# Patient Record
Sex: Female | Born: 1970 | Race: White | Hispanic: No | Marital: Married | Smoking: Never smoker
Health system: Southern US, Community
[De-identification: ages and names within clinical notes are randomized; demographics above are authoritative.]

## PROBLEM LIST (undated history)

## (undated) DIAGNOSIS — F419 Anxiety disorder, unspecified: Secondary | ICD-10-CM

## (undated) DIAGNOSIS — J45909 Unspecified asthma, uncomplicated: Secondary | ICD-10-CM

## (undated) HISTORY — PX: DIAGNOSTIC LAPAROSCOPY: SUR761

## (undated) HISTORY — DX: Anxiety disorder, unspecified: F41.9

## (undated) HISTORY — PX: APPENDECTOMY: SHX54

## (undated) HISTORY — DX: Unspecified asthma, uncomplicated: J45.909

---

## 1999-02-12 ENCOUNTER — Other Ambulatory Visit: Admission: RE | Admit: 1999-02-12 | Discharge: 1999-02-12 | Payer: Self-pay | Admitting: Gynecology

## 2000-03-04 ENCOUNTER — Other Ambulatory Visit: Admission: RE | Admit: 2000-03-04 | Discharge: 2000-03-04 | Payer: Self-pay | Admitting: Obstetrics and Gynecology

## 2000-07-18 ENCOUNTER — Ambulatory Visit (HOSPITAL_COMMUNITY): Admission: RE | Admit: 2000-07-18 | Discharge: 2000-07-18 | Payer: Self-pay | Admitting: Obstetrics and Gynecology

## 2001-03-20 ENCOUNTER — Other Ambulatory Visit: Admission: RE | Admit: 2001-03-20 | Discharge: 2001-03-20 | Payer: Self-pay | Admitting: Obstetrics and Gynecology

## 2002-03-26 ENCOUNTER — Other Ambulatory Visit: Admission: RE | Admit: 2002-03-26 | Discharge: 2002-03-26 | Payer: Self-pay | Admitting: Obstetrics and Gynecology

## 2003-05-02 ENCOUNTER — Other Ambulatory Visit: Admission: RE | Admit: 2003-05-02 | Discharge: 2003-05-02 | Payer: Self-pay | Admitting: Obstetrics and Gynecology

## 2004-05-15 ENCOUNTER — Other Ambulatory Visit: Admission: RE | Admit: 2004-05-15 | Discharge: 2004-05-15 | Payer: Self-pay | Admitting: Obstetrics and Gynecology

## 2005-11-01 ENCOUNTER — Other Ambulatory Visit: Admission: RE | Admit: 2005-11-01 | Discharge: 2005-11-01 | Payer: Self-pay | Admitting: Obstetrics and Gynecology

## 2008-09-30 ENCOUNTER — Ambulatory Visit (HOSPITAL_COMMUNITY): Admission: RE | Admit: 2008-09-30 | Discharge: 2008-09-30 | Payer: Self-pay | Admitting: Geriatric Medicine

## 2009-11-30 IMAGING — CR DG CHEST 2V
2 series · 2 of 2 positions shown · non-contrast
Comparison: None

CLINICAL DATA: Pneumonia.  Coughing.  Acute right-sided chest pain.

CHEST - 2 VIEW

[w chest pa]
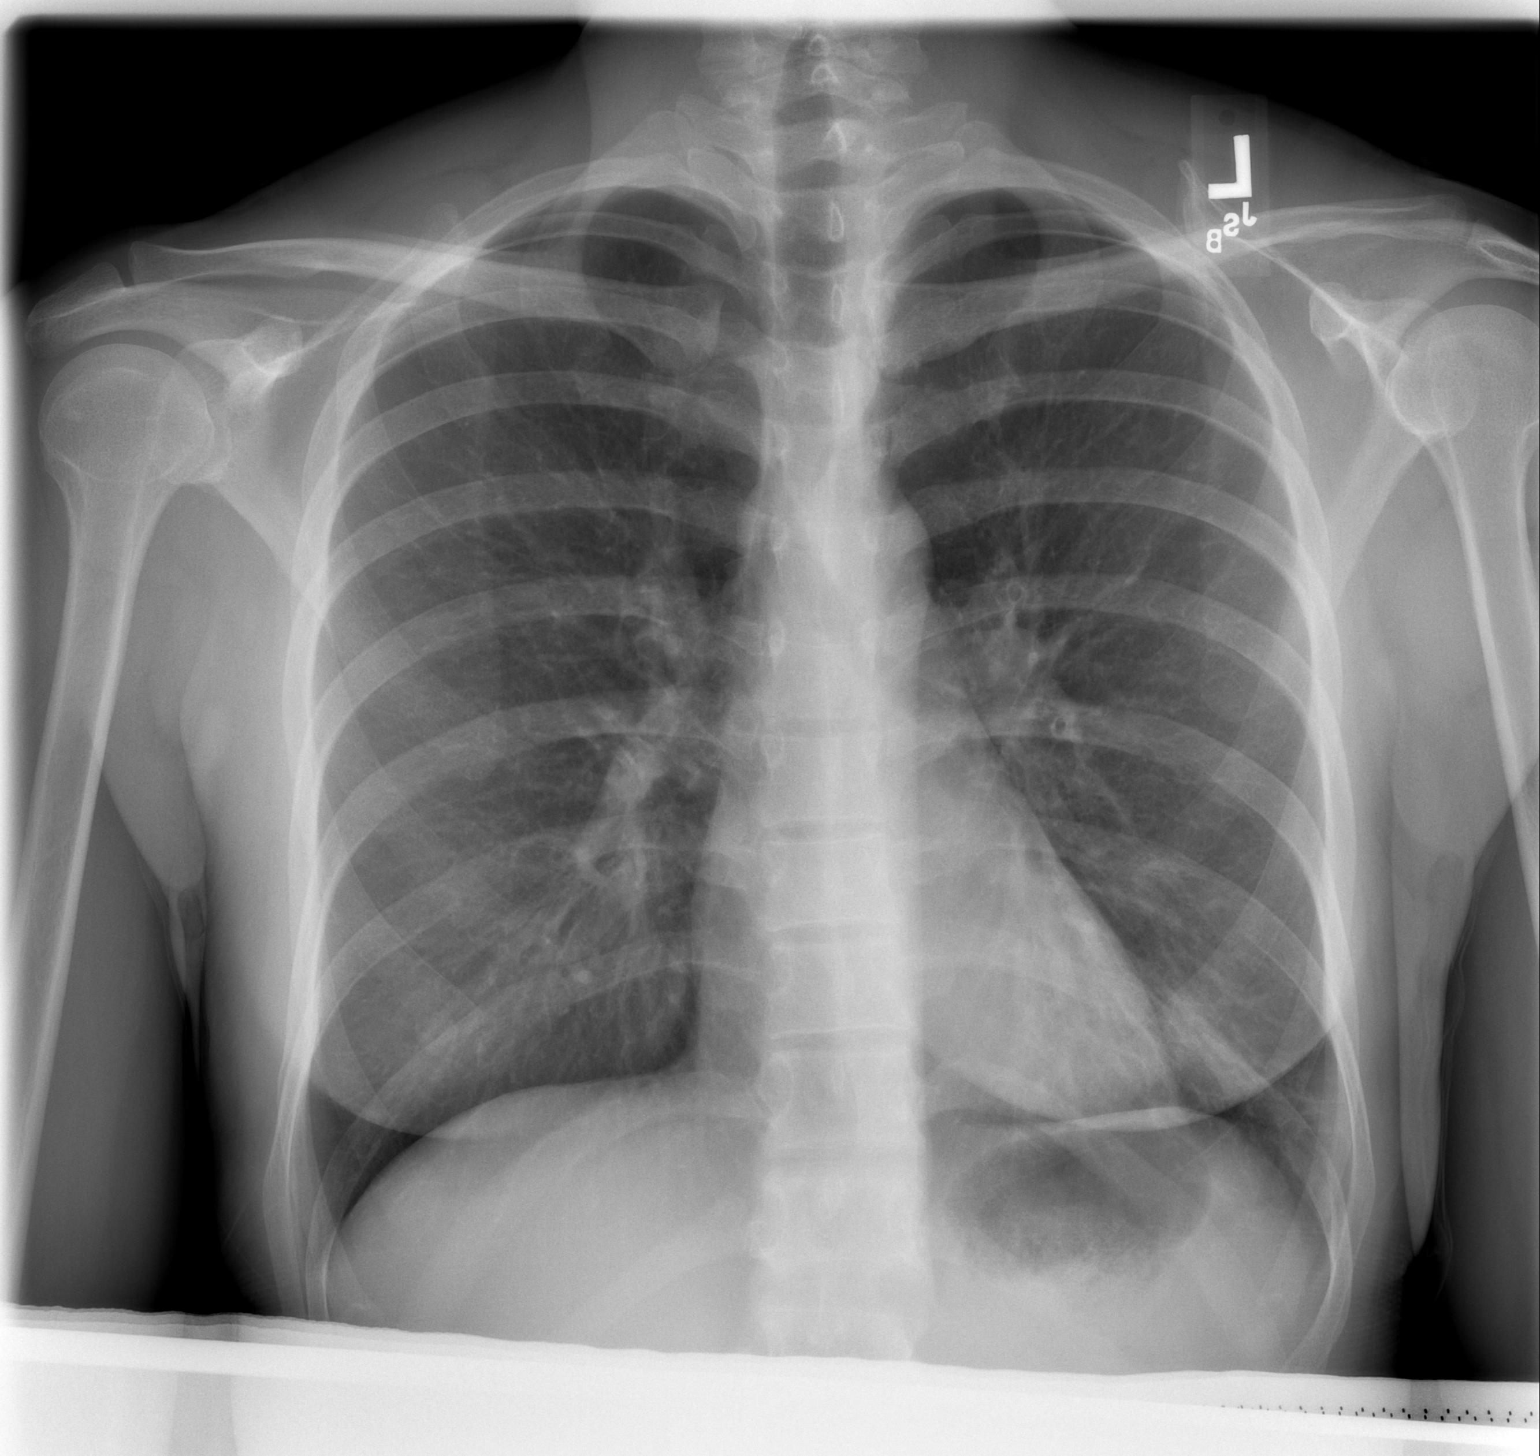

[w chest lat]
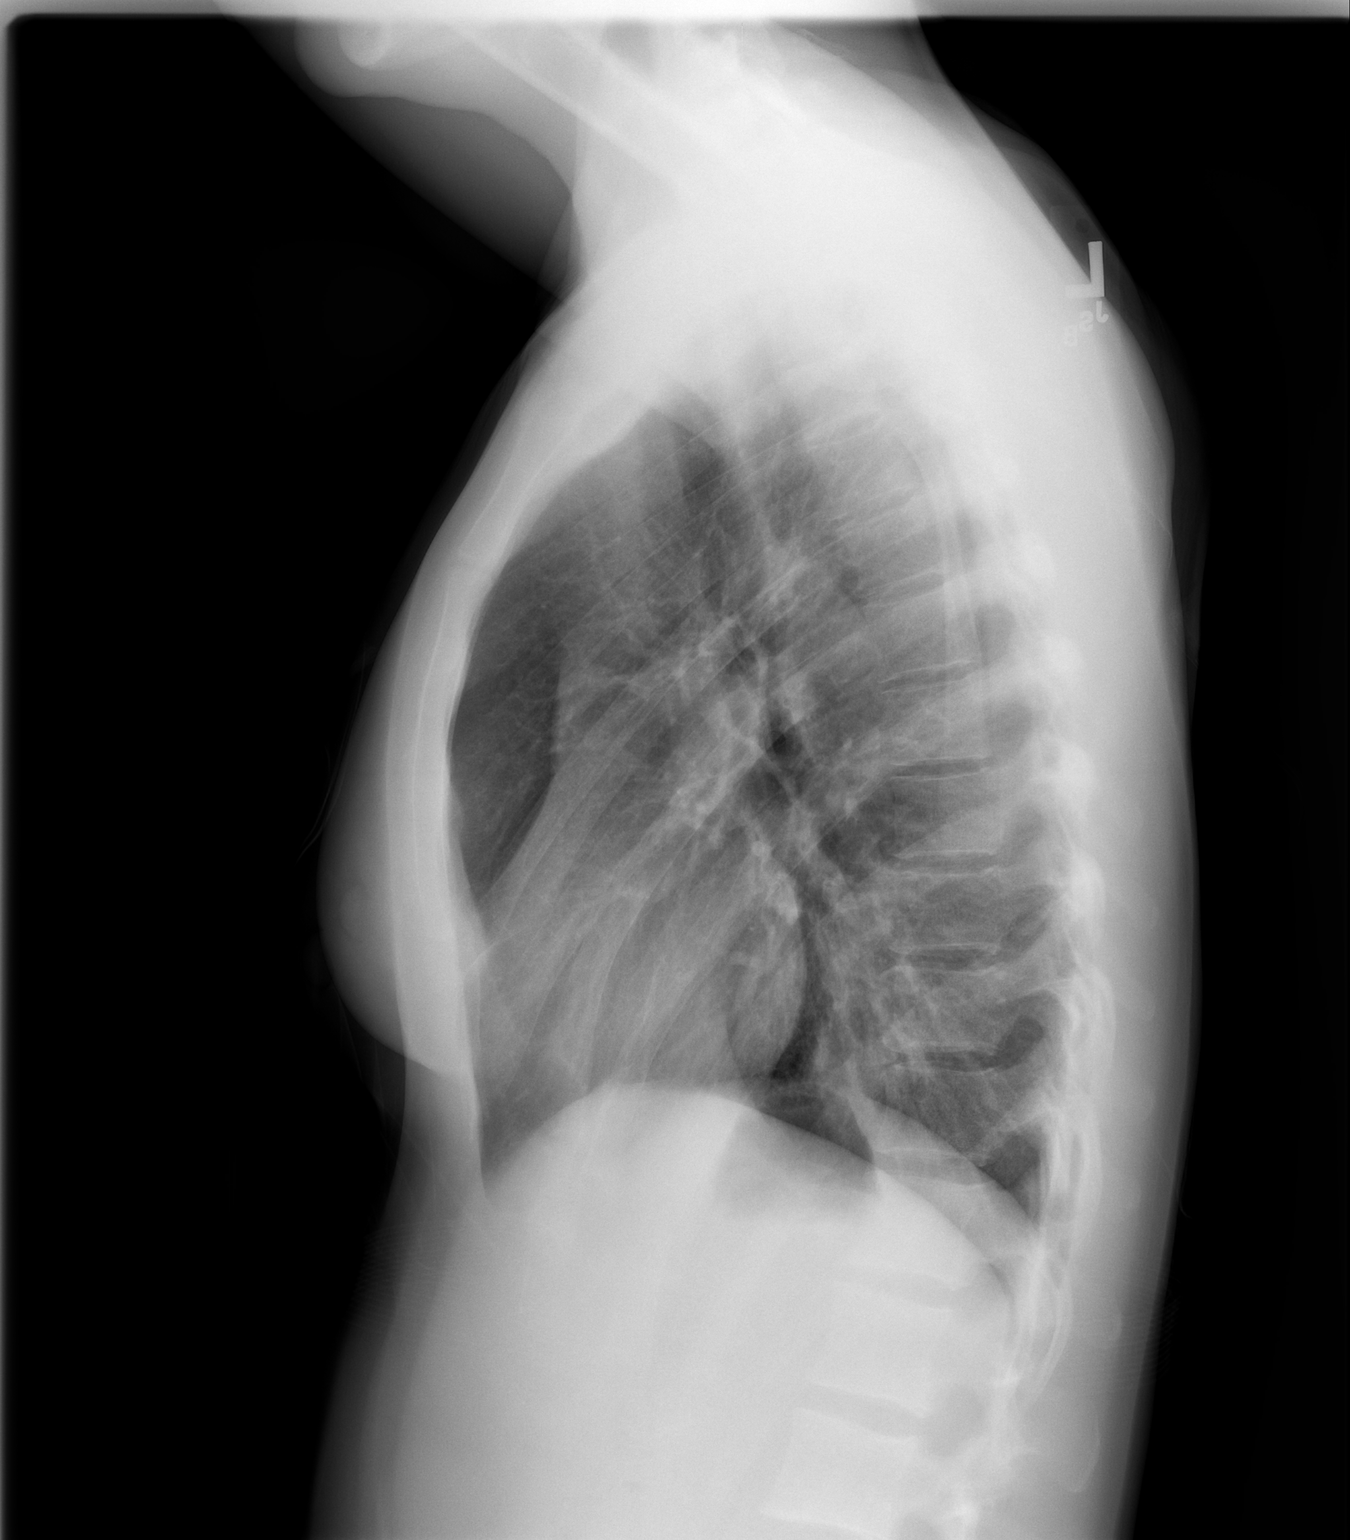

[2 of 2 positions shown; findings below may reference images not displayed]

FINDINGS: Mild pulmonary infiltrate is seen in the left lower lobe,
consistent with pneumonia.

The right lung is clear.  There is no evidence of pneumothorax or
pneumomediastinum.

There is no evidence of pleural effusion.  Heart size and
mediastinal contours are normal.
IMPRESSION: 1.  Left lower lobe infiltrate, consistent with pneumonia.
2.  No evidence of pneumothorax or pleural effusion.

This report was discussed with Dr. Silvina Raut by telephone on

## 2011-02-22 NOTE — Op Note (Signed)
[facility]  Patient:    [name], Karen                        MRN: [id number] Proc. Date: 07/18/00 Adm. Date:  [id number] Attending:  Marcelle Overlie                           Operative Report  PREOPERATIVE DIAGNOSIS:       Desires permanent sterilization.  POSTOPERATIVE DIAGNOSIS:      Desires permanent sterilization.  PROCEDURE:                    Laparoscopic bilateral tubal ligation.  SURGEON:                      Marcelle Overlie, M.D.  ANESTHESIA:                   General.  ESTIMATED BLOOD LOSS:         Minimal.  FINDINGS:                     Normal uterus, tubes, and ovaries.  COMPLICATIONS:                None.  DESCRIPTION OF PROCEDURE:     The patient was taken to the operating room where she was intubated without difficulty.  She was then placed in the lateral lithotomy position.  The patient was then prepped and draped in the usual sterile fashion.  An in-and-out catheter was used to empty the bladder. A uterine manipulator was inserted into the uterus.  Attention was turned to the abdomen where a small infraumbilical incision was made with the scalpel, and the Veress needle was inserted through this incision without difficulty. Intraperitoneal placement was confirmed by the saline aspiration and drop test.  CO2 was attached, and a pneumoperitoneum was achieved with an opening pressure of 3 mmHg.  After the the pneumoperitoneum was achieved, the Veress needle was removed, and a 10-mm trocar was inserted in the same incision without difficulty.  The laparoscope was inserted through the trocar sheath, and the patient was placed in Trendelenburg position.  We then performed a careful abdominal and pelvic survey, and the upper abdomen appeared normal. There was one small, thin omental adhesion at the area of her previous appendectomy incision.  The pelvis was normal with a normal uterus which was anteverted.  There was no evidence of  endometriosis.  The adnexa were normal as well.  Using the operative laparoscope, we then performed bilateral tubal ligation using electrocautery using the triple-burn technique.  Bilateral fimbria were visualized before the electrocoagulation was performed.  The patient tolerated the procedure well.  The pneumoperitoneum was released.  All instruments were removed from the abdomen and the vagina.  The incision was closed using 4-0 Vicryl suture interrupted, and 0.25% Marcaine was infiltrated at the area of the incision.  The patient tolerated the procedure extremely well.  She went to the recovery room in stable condition.  COMPLICATIONS:  None. DD:  07/18/00 TD:  07/20/00 Job: [id number]

## 2014-11-24 ENCOUNTER — Encounter (HOSPITAL_COMMUNITY): Payer: Self-pay | Admitting: *Deleted

## 2014-11-30 NOTE — H&P (Signed)
44 year old G 0 with history of BTL presents for D and C, Hysteroscopy, and HTA. Reports menorrhagia - ultrasound in office - 6 mm polyp  No past medical history on file. Past Surgical History  Procedure Laterality Date  . Diagnostic laparoscopy    . Appendectomy     Augmentin and Clindamycin/lincomycin . No current facility-administered medications on file prior to encounter.   No current outpatient prescriptions on file prior to encounter.   There were no vitals taken for this visit. No results found for this or any previous visit (from the past 24 hour(s)). General alert and oriented Lung CTAB Car RRR Abdomen is soft and non tender Pelvic unremarkable  IMPRESSION: Menorrhagia Endometrial Polyp  PLAN: D and C, Hysteroscopy, HTA Risks reviewed  Consent signed

## 2014-12-04 MED ORDER — CIPROFLOXACIN IN D5W 400 MG/200ML IV SOLN
400.0000 mg | INTRAVENOUS | Status: AC
  Administered 2014-12-05: 400 mg via INTRAVENOUS
  Filled 2014-12-04: qty 200

## 2014-12-04 MED ORDER — METRONIDAZOLE IN NACL 5-0.79 MG/ML-% IV SOLN
500.0000 mg | INTRAVENOUS | Status: AC
  Administered 2014-12-05: 500 mg via INTRAVENOUS
  Filled 2014-12-04: qty 100

## 2014-12-05 ENCOUNTER — Ambulatory Visit (HOSPITAL_COMMUNITY): Payer: BLUE CROSS/BLUE SHIELD | Admitting: Anesthesiology

## 2014-12-05 ENCOUNTER — Ambulatory Visit (HOSPITAL_COMMUNITY)
Admission: RE | Admit: 2014-12-05 | Discharge: 2014-12-05 | Disposition: A | Discharge status: Home / Self Care | Payer: BLUE CROSS/BLUE SHIELD | Source: Ambulatory Visit | Attending: Obstetrics and Gynecology | Admitting: Obstetrics and Gynecology

## 2014-12-05 ENCOUNTER — Encounter (HOSPITAL_COMMUNITY): Payer: Self-pay | Admitting: *Deleted

## 2014-12-05 ENCOUNTER — Encounter (HOSPITAL_COMMUNITY)
Admission: RE | Disposition: A | Discharge status: Home / Self Care | Payer: Self-pay | Source: Ambulatory Visit | Attending: Obstetrics and Gynecology

## 2014-12-05 DIAGNOSIS — N840 Polyp of corpus uteri: Secondary | ICD-10-CM | POA: Diagnosis not present

## 2014-12-05 DIAGNOSIS — Z9049 Acquired absence of other specified parts of digestive tract: Secondary | ICD-10-CM | POA: Diagnosis not present

## 2014-12-05 DIAGNOSIS — N920 Excessive and frequent menstruation with regular cycle: Secondary | ICD-10-CM | POA: Diagnosis present

## 2014-12-05 HISTORY — PX: HYSTEROSCOPY WITH D & C: SHX1775

## 2014-12-05 HISTORY — PX: DILITATION & CURRETTAGE/HYSTROSCOPY WITH HYDROTHERMAL ABLATION: SHX5570

## 2014-12-05 LAB — CBC
HCT: 40.1 % (ref 36.0–46.0)
HEMOGLOBIN: 13.1 g/dL (ref 12.0–15.0)
MCH: 28.8 pg (ref 26.0–34.0)
MCHC: 32.7 g/dL (ref 30.0–36.0)
MCV: 88.1 fL (ref 78.0–100.0)
Platelets: 221 10*3/uL (ref 150–400)
RBC: 4.55 MIL/uL (ref 3.87–5.11)
RDW: 13.6 % (ref 11.5–15.5)
WBC: 6.2 10*3/uL (ref 4.0–10.5)

## 2014-12-05 SURGERY — DILATATION AND CURETTAGE /HYSTEROSCOPY
Anesthesia: General

## 2014-12-05 MED ORDER — MIDAZOLAM HCL 2 MG/2ML IJ SOLN
INTRAMUSCULAR | Status: AC
  Filled 2014-12-05: qty 2

## 2014-12-05 MED ORDER — ONDANSETRON HCL 4 MG/2ML IJ SOLN
4.0000 mg | Freq: Once | INTRAMUSCULAR | Status: AC | PRN
  Administered 2014-12-05: 4 mg via INTRAVENOUS

## 2014-12-05 MED ORDER — PROMETHAZINE HCL 25 MG/ML IJ SOLN: 12.5000 mg | Freq: Once | INTRAMUSCULAR | Status: DC

## 2014-12-05 MED ORDER — LIDOCAINE HCL (CARDIAC) 20 MG/ML IV SOLN
INTRAVENOUS | Status: DC | PRN
  Administered 2014-12-05: 50 mg via INTRAVENOUS

## 2014-12-05 MED ORDER — LACTATED RINGERS IV SOLN
INTRAVENOUS | Status: DC
  Administered 2014-12-05: 12:00:00 via INTRAVENOUS

## 2014-12-05 MED ORDER — DEXAMETHASONE SODIUM PHOSPHATE 10 MG/ML IJ SOLN
INTRAMUSCULAR | Status: DC | PRN
  Administered 2014-12-05: 4 mg via INTRAVENOUS

## 2014-12-05 MED ORDER — MIDAZOLAM HCL 2 MG/2ML IJ SOLN
INTRAMUSCULAR | Status: DC | PRN
  Administered 2014-12-05: 2 mg via INTRAVENOUS

## 2014-12-05 MED ORDER — ONDANSETRON HCL 4 MG/2ML IJ SOLN
INTRAMUSCULAR | Status: DC | PRN
  Administered 2014-12-05: 4 mg via INTRAVENOUS

## 2014-12-05 MED ORDER — FENTANYL CITRATE 0.05 MG/ML IJ SOLN
25.0000 ug | INTRAMUSCULAR | Status: DC | PRN
  Administered 2014-12-05: 50 ug via INTRAVENOUS

## 2014-12-05 MED ORDER — MEPERIDINE HCL 25 MG/ML IJ SOLN: 6.2500 mg | INTRAMUSCULAR | Status: DC | PRN

## 2014-12-05 MED ORDER — PROMETHAZINE HCL 25 MG/ML IJ SOLN
INTRAMUSCULAR | Status: AC
  Filled 2014-12-05: qty 1

## 2014-12-05 MED ORDER — FENTANYL CITRATE 0.05 MG/ML IJ SOLN
INTRAMUSCULAR | Status: AC
  Filled 2014-12-05: qty 2

## 2014-12-05 MED ORDER — LACTATED RINGERS IV SOLN
INTRAVENOUS | Status: DC
  Administered 2014-12-05 (×2): via INTRAVENOUS

## 2014-12-05 MED ORDER — FENTANYL CITRATE 0.05 MG/ML IJ SOLN
INTRAMUSCULAR | Status: AC
  Administered 2014-12-05: 50 ug
  Filled 2014-12-05: qty 2

## 2014-12-05 MED ORDER — FERRIC SUBSULFATE 259 MG/GM EX SOLN
CUTANEOUS | Status: AC
  Filled 2014-12-05: qty 8

## 2014-12-05 MED ORDER — KETOROLAC TROMETHAMINE 30 MG/ML IJ SOLN
INTRAMUSCULAR | Status: AC
Start: 2014-12-05 — End: 2014-12-05
  Filled 2014-12-05: qty 1

## 2014-12-05 MED ORDER — IBUPROFEN 600 MG PO TABS: 600.0000 mg | ORAL_TABLET | Freq: Four times a day (QID) | ORAL | Status: DC | PRN

## 2014-12-05 MED ORDER — ONDANSETRON HCL 4 MG/2ML IJ SOLN
INTRAMUSCULAR | Status: AC
  Filled 2014-12-05: qty 2

## 2014-12-05 MED ORDER — DEXAMETHASONE SODIUM PHOSPHATE 4 MG/ML IJ SOLN
INTRAMUSCULAR | Status: AC
  Filled 2014-12-05: qty 1

## 2014-12-05 MED ORDER — KETOROLAC TROMETHAMINE 30 MG/ML IJ SOLN
INTRAMUSCULAR | Status: DC | PRN
  Administered 2014-12-05: 30 mg via INTRAVENOUS

## 2014-12-05 MED ORDER — PROPOFOL 10 MG/ML IV BOLUS
INTRAVENOUS | Status: DC | PRN
  Administered 2014-12-05: 150 mg via INTRAVENOUS

## 2014-12-05 MED ORDER — SCOPOLAMINE 1 MG/3DAYS TD PT72
MEDICATED_PATCH | TRANSDERMAL | Status: AC
  Administered 2014-12-05: 1.5 mg via TRANSDERMAL
  Filled 2014-12-05: qty 1

## 2014-12-05 MED ORDER — LIDOCAINE HCL 1 % IJ SOLN
INTRAMUSCULAR | Status: AC
  Filled 2014-12-05: qty 20

## 2014-12-05 MED ORDER — PROPOFOL 10 MG/ML IV BOLUS
INTRAVENOUS | Status: AC
  Filled 2014-12-05: qty 20

## 2014-12-05 MED ORDER — SCOPOLAMINE 1 MG/3DAYS TD PT72
1.0000 | MEDICATED_PATCH | Freq: Once | TRANSDERMAL | Status: DC
  Administered 2014-12-05: 1.5 mg via TRANSDERMAL

## 2014-12-05 MED ORDER — LIDOCAINE HCL (CARDIAC) 20 MG/ML IV SOLN
INTRAVENOUS | Status: AC
  Filled 2014-12-05: qty 5

## 2014-12-05 MED ORDER — FENTANYL CITRATE 0.05 MG/ML IJ SOLN
INTRAMUSCULAR | Status: DC | PRN
  Administered 2014-12-05 (×4): 50 ug via INTRAVENOUS

## 2014-12-05 SURGICAL SUPPLY — 16 items
CANISTER SUCT 3000ML (MISCELLANEOUS) ×2 IMPLANT
CATH ROBINSON RED A/P 16FR (CATHETERS) ×2 IMPLANT
CLOTH BEACON ORANGE TIMEOUT ST (SAFETY) ×2 IMPLANT
CONTAINER PREFILL 10% NBF 60ML (FORM) ×4 IMPLANT
ELECT REM PT RETURN 9FT ADLT (ELECTROSURGICAL) ×2
ELECTRODE REM PT RTRN 9FT ADLT (ELECTROSURGICAL) ×1 IMPLANT
GLOVE BIO SURGEON STRL SZ 6.5 (GLOVE) ×5 IMPLANT
GOWN STRL REUS W/TWL LRG LVL3 (GOWN DISPOSABLE) ×4 IMPLANT
LOOP ANGLED CUTTING 22FR (CUTTING LOOP) IMPLANT
PACK VAGINAL MINOR WOMEN LF (CUSTOM PROCEDURE TRAY) ×2 IMPLANT
PAD OB MATERNITY 4.3X12.25 (PERSONAL CARE ITEMS) ×2 IMPLANT
SET GENESYS HTA PROCERVA (MISCELLANEOUS) ×2 IMPLANT
TOWEL OR 17X24 6PK STRL BLUE (TOWEL DISPOSABLE) ×4 IMPLANT
TUBING AQUILEX INFLOW (TUBING) ×2 IMPLANT
TUBING AQUILEX OUTFLOW (TUBING) ×2 IMPLANT
WATER STERILE IRR 1000ML POUR (IV SOLUTION) ×2 IMPLANT

## 2014-12-05 NOTE — Progress Notes (Signed)
H and P on the chart No changes Will proceed with D and C, Hysteroscopy, HTA Consent signed

## 2014-12-05 NOTE — Brief Op Note (Signed)
12/05/2014  2:16 PM  PATIENT:  Karen [name]  44 y.o. female  PRE-OPERATIVE DIAGNOSIS:  MENORRAGHIA, POLYP  POST-OPERATIVE DIAGNOSIS:  MENORRAGHIA, POLYP  PROCEDURE:  Procedure(s): DILATATION AND CURETTAGE /HYSTEROSCOPY DILATATION & CURETTAGE/HYSTEROSCOPY WITH HYDROTHERMAL ABLATION  SURGEON:  Surgeon(s) and Role:    * Cyril Mourning, MD - Primary  PHYSICIAN ASSISTANT:   ASSISTANTS: none   ANESTHESIA:   paracervical block and MAC  EBL:  Total I/O In: 1000 [I.V.:1000] Out: 60 [Urine:50; Blood:10]  BLOOD ADMINISTERED:none  DRAINS: none   LOCAL MEDICATIONS USED:  NONE  SPECIMEN:  Source of Specimen:  uterine currettings  DISPOSITION OF SPECIMEN:  PATHOLOGY  COUNTS:  YES  TOURNIQUET:  * No tourniquets in log *  DICTATION: .Other Dictation: Dictation Number [id number]  PLAN OF CARE: Discharge to home after PACU  PATIENT DISPOSITION:  PACU - hemodynamically stable.   Delay start of Pharmacological VTE agent (>24hrs) due to surgical blood loss or risk of bleeding: not applicable

## 2014-12-05 NOTE — Anesthesia Procedure Notes (Signed)
Procedure Name: LMA Insertion Date/Time: 12/05/2014 1:27 PM Performed by: Jonna Munro Pre-anesthesia Checklist: Patient identified, Emergency Drugs available, Suction available, Timeout performed and Patient being monitored Patient Re-evaluated:Patient Re-evaluated prior to inductionOxygen Delivery Method: Circle system utilized Preoxygenation: Pre-oxygenation with 100% oxygen Intubation Type: IV induction LMA: LMA inserted LMA Size: 4.0 Number of attempts: 1 Placement Confirmation: breath sounds checked- equal and bilateral and positive ETCO2 Dental Injury: Teeth and Oropharynx as per pre-operative assessment

## 2014-12-05 NOTE — Anesthesia Postprocedure Evaluation (Signed)
Anesthesia Post Note  Patient: Karen [name]  Procedure(s) Performed: Procedure(s): DILATATION AND CURETTAGE /HYSTEROSCOPY DILATATION & CURETTAGE/HYSTEROSCOPY WITH HYDROTHERMAL ABLATION  Anesthesia type: General  Patient location: PACU  Post pain: Pain level controlled  Post assessment: Post-op Vital signs reviewed  Last Vitals:  Filed Vitals:   12/05/14 1600  BP: 133/71  Pulse: 66  Temp:   Resp: 26    Post vital signs: Reviewed  Level of consciousness: sedated  Complications: No apparent anesthesia complications

## 2014-12-05 NOTE — Anesthesia Preprocedure Evaluation (Signed)
Anesthesia Evaluation  Patient identified by MRN, date of birth, ID band Patient awake    Reviewed: Allergy & Precautions, H&P , NPO status , Patient's Chart, lab work & pertinent test results  Airway Mallampati: I  TM Distance: >3 FB Neck ROM: full    Dental no notable dental hx. (+) Teeth Intact   Pulmonary neg pulmonary ROS,          Cardiovascular negative cardio ROS      Neuro/Psych negative neurological ROS     GI/Hepatic negative GI ROS, Neg liver ROS,   Endo/Other  negative endocrine ROS  Renal/GU negative Renal ROS     Musculoskeletal   Abdominal Normal abdominal exam  (+)   Peds  Hematology negative hematology ROS (+)   Anesthesia Other Findings   Reproductive/Obstetrics negative OB ROS                             Anesthesia Physical Anesthesia Plan  ASA: II  Anesthesia Plan: General   Post-op Pain Management:    Induction: Intravenous  Airway Management Planned: LMA  Additional Equipment:   Intra-op Plan:   Post-operative Plan:   Informed Consent: I have reviewed the patients History and Physical, chart, labs and discussed the procedure including the risks, benefits and alternatives for the proposed anesthesia with the patient or authorized representative who has indicated his/her understanding and acceptance.     Plan Discussed with: CRNA and Surgeon  Anesthesia Plan Comments:         Anesthesia Quick Evaluation

## 2014-12-05 NOTE — Transfer of Care (Signed)
Immediate Anesthesia Transfer of Care Note  Patient: Karen [name]  Procedure(s) Performed: Procedure(s): DILATATION AND CURETTAGE /HYSTEROSCOPY DILATATION & CURETTAGE/HYSTEROSCOPY WITH HYDROTHERMAL ABLATION  Patient Location: PACU  Anesthesia Type:General  Level of Consciousness: awake, alert  and oriented  Airway & Oxygen Therapy: Patient Spontanous Breathing and Patient connected to nasal cannula oxygen  Post-op Assessment: Report given to RN and Post -op Vital signs reviewed and stable  Post vital signs: Reviewed and stable  Last Vitals:  Filed Vitals:   12/05/14 1210  BP: 104/62  Pulse: 63  Temp: 36.7 C  Resp: 20    Complications: No apparent anesthesia complications

## 2014-12-05 NOTE — Discharge Instructions (Signed)
DISCHARGE INSTRUCTIONS: D&C / D&E The following instructions have been prepared to help you care for yourself upon your return home.   Personal hygiene:  Use sanitary pads for vaginal drainage, not tampons.  Shower the day after your procedure.  NO tub baths, pools or Jacuzzis for 2-3 weeks.  Wipe front to back after using the bathroom.  Activity and limitations:  Do NOT drive or operate any equipment for 24 hours. The effects of anesthesia are still present and drowsiness may result.  Do NOT rest in bed all day.  Walking is encouraged.  Walk up and down stairs slowly.  You may resume your normal activity in one to two days or as indicated by your physician.  Sexual activity: NO intercourse for at least 2 weeks after the procedure, or as indicated by your physician.  Diet: Eat a light meal as desired this evening. You may resume your usual diet tomorrow.  Return to work: You may resume your work activities in one to two days or as indicated by your doctor.  What to expect after your surgery: Expect to have vaginal bleeding/discharge for 2-3 days and spotting for up to 10 days. It is not unusual to have soreness for up to 1-2 weeks. You may have a slight burning sensation when you urinate for the first day. Mild cramps may continue for a couple of days. You may have a regular period in 2-6 weeks.  Call your doctor for any of the following:  Excessive vaginal bleeding, saturating and changing one pad every hour.  Inability to urinate 6 hours after discharge from hospital.  Pain not relieved by pain medication.  Fever of 100.4 F or greater.  Unusual vaginal discharge or odor.   Call for an appointment:    Patients signature: ______________________  Nurses signature ________________________  Support person's signature_______________________    Post Anesthesia Home Care Instructions  Activity: Get plenty of rest for the remainder of the day. A responsible  adult should stay with you for 24 hours following the procedure.  For the next 24 hours, DO NOT: -Drive a car -Paediatric nurse -Drink alcoholic beverages -Take any medication unless instructed by your physician -Make any legal decisions or sign important papers.  Meals: Start with liquid foods such as gelatin or soup. Progress to regular foods as tolerated. Avoid greasy, spicy, heavy foods. If nausea and/or vomiting occur, drink only clear liquids until the nausea and/or vomiting subsides. Call your physician if vomiting continues.  Special Instructions/Symptoms: Your throat may feel dry or sore from the anesthesia or the breathing tube placed in your throat during surgery. If this causes discomfort, gargle with warm salt water. The discomfort should disappear within 24 hours.

## 2014-12-06 ENCOUNTER — Encounter (HOSPITAL_COMMUNITY): Payer: Self-pay | Admitting: Obstetrics and Gynecology

## 2014-12-06 NOTE — Op Note (Signed)
NAMEBEYLA, LONEY                 ACCOUNT NO.:  [id number]  MEDICAL RECORD NO.:  [id number]  LOCATION:  [facility]                          FACILITY:  [facility]  PHYSICIAN:  Akyra Bouchie L. Niambi Smoak, M.D.DATE OF BIRTH:  1971-02-23  DATE OF PROCEDURE:  12/05/2014 DATE OF DISCHARGE:  12/05/2014                              OPERATIVE REPORT   PREOPERATIVE DIAGNOSES:  Menorrhagia and endometrial polyp.  POSTOPERATIVE DIAGNOSES:  Menorrhagia and endometrial polyp.  PROCEDURE:  Dilation and curettage, hysteroscopy, removal of endometrial polyps and hydrothermal ablation of endometrium.  SURGEON:  Dian Queen, MD.  ANESTHESIA:  General.  ESTIMATED BLOOD LOSS:  Minimal.  COMPLICATIONS:  None.  FLUID DEFICIT:  0 mL.  DESCRIPTION OF PROCEDURE:  The patient was taken to the operating room. Her anesthesia was administered.  She was prepped and draped.  The speculum was inserted.  The cervix was grasped with a tenaculum and the cervical internal os was gently dilated to a 23 Pratt dilator.  The hysteroscope was inserted and with excellent visualization, I could see a polypoid area arising from the right wall of her uterus.  The hysteroscope was removed.  A sharp curette was inserted and the uterus was thoroughly curetted of all tissue and sent to Pathology.  The HTA scope was then inserted easily and after priming appropriately and checking to make sure there was no fluid leakage, then an HTA was performed under direct visualization for 10 minutes.  There was a 0 fluid deficit.  This was performed according to the manufacturer's specifications.  At the end of the procedure, the hysteroscope was removed.  There was a little bit of bleeding at the tenaculum site which was hemostatic after silver nitrate and application of the Monsel's solution.  The patient tolerated the procedure well, went to recovery room in stable condition.     Evamaria Detore L. Helane Rima, M.D.    Nevin Bloodgood  D:  12/05/2014  T:   12/06/2014  Job:  [id number]

## 2016-01-29 DIAGNOSIS — J019 Acute sinusitis, unspecified: Secondary | ICD-10-CM | POA: Diagnosis not present

## 2016-05-22 DIAGNOSIS — J321 Chronic frontal sinusitis: Secondary | ICD-10-CM | POA: Diagnosis not present

## 2016-05-24 DIAGNOSIS — R05 Cough: Secondary | ICD-10-CM | POA: Diagnosis not present

## 2016-05-24 DIAGNOSIS — R0609 Other forms of dyspnea: Secondary | ICD-10-CM | POA: Diagnosis not present

## 2016-05-24 DIAGNOSIS — J019 Acute sinusitis, unspecified: Secondary | ICD-10-CM | POA: Diagnosis not present

## 2016-05-24 DIAGNOSIS — T887XXA Unspecified adverse effect of drug or medicament, initial encounter: Secondary | ICD-10-CM | POA: Diagnosis not present

## 2016-05-24 DIAGNOSIS — J45909 Unspecified asthma, uncomplicated: Secondary | ICD-10-CM | POA: Diagnosis not present

## 2016-07-18 DIAGNOSIS — Z130 Encounter for screening for diseases of the blood and blood-forming organs and certain disorders involving the immune mechanism: Secondary | ICD-10-CM | POA: Diagnosis not present

## 2016-07-18 DIAGNOSIS — Z131 Encounter for screening for diabetes mellitus: Secondary | ICD-10-CM | POA: Diagnosis not present

## 2016-07-18 DIAGNOSIS — Z0000 Encounter for general adult medical examination without abnormal findings: Secondary | ICD-10-CM | POA: Diagnosis not present

## 2016-07-18 DIAGNOSIS — Z1231 Encounter for screening mammogram for malignant neoplasm of breast: Secondary | ICD-10-CM | POA: Diagnosis not present

## 2016-07-18 DIAGNOSIS — Z01419 Encounter for gynecological examination (general) (routine) without abnormal findings: Secondary | ICD-10-CM | POA: Diagnosis not present

## 2016-07-18 DIAGNOSIS — Z13220 Encounter for screening for lipoid disorders: Secondary | ICD-10-CM | POA: Diagnosis not present

## 2016-07-18 DIAGNOSIS — Z13228 Encounter for screening for other metabolic disorders: Secondary | ICD-10-CM | POA: Diagnosis not present

## 2016-07-18 DIAGNOSIS — Z6826 Body mass index (BMI) 26.0-26.9, adult: Secondary | ICD-10-CM | POA: Diagnosis not present

## 2016-09-25 DIAGNOSIS — N941 Unspecified dyspareunia: Secondary | ICD-10-CM | POA: Diagnosis not present

## 2017-01-16 ENCOUNTER — Ambulatory Visit: Payer: BLUE CROSS/BLUE SHIELD | Admitting: Sports Medicine

## 2017-01-23 ENCOUNTER — Ambulatory Visit (INDEPENDENT_AMBULATORY_CARE_PROVIDER_SITE_OTHER): Payer: BLUE CROSS/BLUE SHIELD | Admitting: Sports Medicine

## 2017-01-23 ENCOUNTER — Encounter: Payer: Self-pay | Admitting: Sports Medicine

## 2017-01-23 DIAGNOSIS — M722 Plantar fascial fibromatosis: Secondary | ICD-10-CM | POA: Insufficient documentation

## 2017-01-23 NOTE — Progress Notes (Signed)
  HPI:  Karen [name] is a 46yo presenting with L heel pain. Pain has been present for about 9 months and is located on medial plantar aspect of heel. No injury or any inciting event to cause the pain. Is on her feet all day and pain is worse at the end of the day and also worse when going to standing from sitting. Has been taking ibuprofen which helps some, and has been wearing inserts that also have helped somewhat. Used to walk a lot and do exercise classes which she has stopped because of the pain.   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 Normal ROM to dorsiflexion, plantarflexion, internal and external rotation. 5/5 strength at ankle. No tenderness to palpation along ankle bones or over plantar fascia. Normal ROM at first toe. L foot: No swelling or erythema.  Normal ROM to dorsiflexion, plantarflexion, internal and external rotation. 5/5 strength at ankle. Point tenderness to palpation over medial plantar surface over plantar fascia. Normal ROM at first toe.   Mod high arch on sitting with some loss of arch standing  Ultrasound: Increased thickness of proximal LT plantar fascia.  Measures 0.57.  Slightly hypoechoic.  Comparison view of RT PF shows only 0.36 cms thick.  PF on left is also 0.37 at 2 cms distal to insertion No bone abnormalities visualized.  Assessment: Presentation is consistent with plantar fasciitis, present for about 9 months.  Plan: - Provided extra lift to pt's home orthotic inserts to further alleviate pressure on area of inflammation. - Provided instructions with handout for home exercises and stretches, recommendation for putting foot in ice  - Return if no improvement by 2 mos and will consider  custom orthotics at that time.  [facility], PGY1 01/23/17  I observed and examined the patient with the resident and agree with assessment and plan.  Note reviewed and modified by me. Stefanie Libel, MD

## 2017-01-23 NOTE — Assessment & Plan Note (Signed)
Stretches HEP Use insoles with modification Arch strap Icing

## 2017-02-12 ENCOUNTER — Encounter: Payer: Self-pay | Admitting: Family Medicine

## 2017-02-12 ENCOUNTER — Ambulatory Visit (INDEPENDENT_AMBULATORY_CARE_PROVIDER_SITE_OTHER): Payer: BLUE CROSS/BLUE SHIELD | Admitting: Family Medicine

## 2017-02-12 VITALS — BP 110/63 | Ht 63.0 in | Wt 140.0 lb

## 2017-02-12 DIAGNOSIS — M722 Plantar fascial fibromatosis: Secondary | ICD-10-CM | POA: Diagnosis not present

## 2017-02-12 MED ORDER — METHYLPREDNISOLONE ACETATE 40 MG/ML IJ SUSP
40.0000 mg | Freq: Once | INTRAMUSCULAR | Status: AC
  Administered 2017-02-12: 40 mg via INTRA_ARTICULAR

## 2017-02-13 MED ORDER — METHYLPREDNISOLONE ACETATE 40 MG/ML IJ SUSP: 40.0000 mg | Freq: Once | INTRAMUSCULAR | Status: AC

## 2017-02-16 NOTE — Assessment & Plan Note (Signed)
Will try an injection today. Could benefit from custom orthotics in the future  - f/u 2 weeks and could make orthotics them.

## 2017-02-16 NOTE — Progress Notes (Signed)
  Karen [name] - 46 y.o. female MRN [id number]  Date of birth: 08/07/1971  SUBJECTIVE:  Including CC & ROS.   Karen [name] is a 46 yo F that is following up for plantar fasciitis. Pain has been present for about 10 months and has gotten worse over the past two weeks. She has tried ibuprofen and exercises. She has pain with the first few steps in the morning.   ROS: No unexpected weight loss, fever, chills, swelling, instability, muscle pain, numbness/tingling, redness, otherwise see HPI   HISTORY: Past Medical, Surgical, Social, and Family History Reviewed & Updated per EMR.   Pertinent Historical Findings include: PMH: none  Surgical: D&C, appendectomy  Social:  No tobacco use   DATA REVIEWED: none  PHYSICAL EXAM:  VS: BP 110/63   Ht 5\' 3"  (1.6 m)   Wt 140 lb (63.5 kg)   BMI 24.80 kg/m  PHYSICAL EXAM: Gen: NAD, alert, cooperative with exam, well-appearing HEENT: clear conjunctiva, EOMI CV:  no edema, capillary refill brisk,  Resp: non-labored, normal speech Skin: no rashes, normal turgor  Neuro: no gross deficits.  Psych:  alert and oriented MSK:  Left foot:  TTP of the calcaneous  Dorsiflexion on left is limited compared to right  No TTP of the achilles  Neurovascularly intact   Aspiration/Injection Procedure Note Karen [name] 1971/07/06  Procedure: Injection Indications: left plantar fasciitis   Procedure Details Consent: Risks of procedure as well as the alternatives and risks of each were explained to the (patient/caregiver).  Consent for procedure obtained. Time Out: Verified patient identification, verified procedure, site/side was marked, verified correct patient position, special equipment/implants available, medications/allergies/relevent history reviewed, required imaging and test results available.  Performed.  The area was cleaned with iodine and alcohol swabs.    The left plantar fascia was injected using 1 cc's of 40 mg Depomedrol and 3 cc's of 1%  lidocaine with a 25 1 1/2" needle.  Ultrasound was used.     A sterile dressing was applied.  Patient did tolerate procedure well.  ASSESSMENT & PLAN:   Plantar fasciitis of left foot Will try an injection today. Could benefit from custom orthotics in the future  - f/u 2 weeks and could make orthotics them.

## 2017-03-27 ENCOUNTER — Ambulatory Visit (INDEPENDENT_AMBULATORY_CARE_PROVIDER_SITE_OTHER): Payer: BLUE CROSS/BLUE SHIELD | Admitting: Sports Medicine

## 2017-03-27 ENCOUNTER — Encounter: Payer: Self-pay | Admitting: Sports Medicine

## 2017-03-27 DIAGNOSIS — M722 Plantar fascial fibromatosis: Secondary | ICD-10-CM | POA: Diagnosis not present

## 2017-03-27 NOTE — Assessment & Plan Note (Signed)
Patient was fitted for a : standard, cushioned, semi-rigid orthotic. The orthotic was heated and afterward the patient stood on the orthotic blank positioned on the orthotic stand. The patient was positioned in subtalar neutral position and 10 degrees of ankle dorsiflexion in a weight bearing stance. After completion of molding, a stable base was applied to the orthotic blank. The blank was ground to a stable position for weight bearing. Size: 6 Red EVA Base: Blue Med density EVA Posting:  Heel wedge left Additional orthotic padding: none  Try these and we will assess if gait improves and pain less

## 2017-03-27 NOTE — Progress Notes (Signed)
   Subjective:    Karen [name] - 46 y.o. female MRN [id number]  Date of birth: 1971-04-24  CC: Left foot pain  HPI: Karen [name] is a 46 y/o female presenting for left foot pain. She was seen in our clinic two months ago and was diagnosed with plantar fasciitis of left foot (0.57 cm thick on U/S). She came back a month later and received a injection in the left foot. She states that the injection provided relief for only 3-4 days. Endorses pain that is worse with activity. Endorses stiffness in the mornings. The heel lift in her home orthotic inserts did not provide much relief. She uses ibuprofen with minimal relief as well. She continues to do home exercises and stretches. Denies trauma to her feet. She would like to try custom orthotics for further relief.  ROS: Denies numbness, tingling or radiation. No sciatic symptoms. Negative except per HPI.  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 Arch: Pes cavus while sitting with loss of arch upon standing 5/5 strength at ankle. No tenderness to palpation along ankle bones, MT heads, or over plantar fascia.  FROM to dorsiflexion, plantarflexion, internal and external rotation. Normal ROM at first toe. Sensation grossly intact +2 PT pulse  L foot: No swelling, erythema, ulcers, or obvious bony deformities. Arch: Pes cavus while sitting with loss of arch upon standing 5/5 strength at ankle. TTP over medial plantar aspect and over plantar fascia. FROM to dorsiflexion, plantarflexion, internal and external rotation. Normal ROM at first toe. Sensation grossly intact +2 PT pulse    Assessment & Plan:  Karen [name] is a  46 y/o female presenting for left foot pain.  Plantar fasciitis Her exam and symptoms are consistent with plantar fasciitis. However, she also has significant loss of her arches upon standing contributing to possible metatarsal insufficiency which could be worsening her pain. Since has had only minimal relief with the injection, we will try custom orthotics today with a heel lift and have her continue stretching exercises. She had to leave early to pick up her kids from summer camp so we made the mold today for her orthotics and she will return tomorrow morning to try them on. -Custom orthotics today -Continue home exercises and stretching -F/u in a month  I personally was present and performed or re-performed the history, physical exam and medical decision-making activities of this service and have verified that the service and findings are accurately documented in the student's note. Stefanie Libel, MD

## 2017-08-16 DIAGNOSIS — Z800 Family history of malignant neoplasm of digestive organs: Secondary | ICD-10-CM | POA: Diagnosis not present

## 2017-08-16 DIAGNOSIS — Z803 Family history of malignant neoplasm of breast: Secondary | ICD-10-CM | POA: Diagnosis not present

## 2017-08-16 DIAGNOSIS — Z808 Family history of malignant neoplasm of other organs or systems: Secondary | ICD-10-CM | POA: Diagnosis not present

## 2017-08-16 DIAGNOSIS — Z8052 Family history of malignant neoplasm of bladder: Secondary | ICD-10-CM | POA: Diagnosis not present

## 2017-08-18 DIAGNOSIS — Z1231 Encounter for screening mammogram for malignant neoplasm of breast: Secondary | ICD-10-CM | POA: Diagnosis not present

## 2017-08-18 DIAGNOSIS — Z6827 Body mass index (BMI) 27.0-27.9, adult: Secondary | ICD-10-CM | POA: Diagnosis not present

## 2017-08-18 DIAGNOSIS — Z1212 Encounter for screening for malignant neoplasm of rectum: Secondary | ICD-10-CM | POA: Diagnosis not present

## 2017-08-18 DIAGNOSIS — Z01419 Encounter for gynecological examination (general) (routine) without abnormal findings: Secondary | ICD-10-CM | POA: Diagnosis not present

## 2017-10-09 ENCOUNTER — Encounter: Payer: Self-pay | Admitting: Gastroenterology

## 2017-10-09 DIAGNOSIS — Z809 Family history of malignant neoplasm, unspecified: Secondary | ICD-10-CM | POA: Diagnosis not present

## 2017-10-14 ENCOUNTER — Encounter: Payer: Self-pay | Admitting: Sports Medicine

## 2017-10-14 ENCOUNTER — Ambulatory Visit (INDEPENDENT_AMBULATORY_CARE_PROVIDER_SITE_OTHER): Payer: BLUE CROSS/BLUE SHIELD | Admitting: Sports Medicine

## 2017-10-14 VITALS — BP 109/70 | Ht 63.0 in | Wt 144.0 lb

## 2017-10-14 DIAGNOSIS — M722 Plantar fascial fibromatosis: Secondary | ICD-10-CM

## 2017-10-14 DIAGNOSIS — Q667 Congenital pes cavus: Secondary | ICD-10-CM | POA: Diagnosis not present

## 2017-10-14 DIAGNOSIS — Q6671 Congenital pes cavus, right foot: Secondary | ICD-10-CM

## 2017-10-14 NOTE — Progress Notes (Signed)
Chief complaint: Right heel pain 3 months  History of present illness: Karen [name] is a 47 year old female who presents to the sports medicine office today with chief complaint of right heel pain. She reports that symptoms have been present for approximately 3 months patient is not report of any specific inciting incident, trauma, or injury to explain the pain. She describes the pain being on the plantar aspect of her right foot near the plantar fascia insertion at the calcaneus. She reports pain first thing when she gets out of bed in the morning, starts to get somewhat better during the course of the morning and early afternoon, but worsens during the evening time. That she is not able to do any type or running workouts secondary to the pain. She was recently seen here earlier this year for plantar fasciitis of her left foot. Unfortunately, she has not had any issues with that over the last few months. She reports that she has tried doing stretches on the right side, but do cause pain. She has had orthotics made, with medial heel wedge on the left side, but no additional support on the right side. She reports she has tried occasional ibuprofen, with no improvement in her symptoms. Today, she rates the pain as a 3/10, with particularly any type of dorsiflexion causing sharp pain. No changes in terms of running frequency or intensity.  Review of systems:  As stated above  Interval past medical history, surgical history, family history, and social history obtained and unchanged. No chronic medical problems, no current tobacco use, no surgeries involving right foot, family history noncontributory to chief complaint.  Physical exam: Vital signs are reviewed and are documented in the chart Gen.: Alert, oriented, appears stated age, in no apparent distress HEENT: Moist oral mucosa Respiratory: Normal respirations, able to speak in full sentences Cardiac: Regular rate, distal pulses 2+ Integumentary: No rashes  on visible skin:  Neurologic: Strength 5/5, sensation 2+ in bilateral lower extremities Psych: Normal affect, mood is described as good Musculoskeletal: Inspection of right foot reveals no obvious deformity or muscle atrophy, no warmth, erythema, ecchymosis, or effusion, she is specifically point tender the plantar fascia at the calcaneal insertion more medially, pain is accenuated with dorsiflexion, no tenderness over medial lateral malleolus, no tenderness over Achilles or retrocalcaneal bursa, she has full ankle range of motion, she does have congenitally high pes cavus with splaying of the first and second toes  Limited musculoskeletal ultrasound was performed in the office today of both of her plantar fascia. Key findings of the ultrasound revealed that she does have abnormal plantar fascia on the right side, measuring 0.73 cm, with hypoechoic changes seen in the proximal insertion at the calcaneus. Distally, the left plantar fascia is about 40% improved today from when it was last examined back in May of this year.  Assessment and plan: 1. Right plantar fasciitis 2. History of left plantar fasciitis 3. Bilateral congenital pes cavus  Plan: Discussed with Claiborne Billings that her symptoms are consistent with plantar fasciitis on the right side. Discussed changes to orthotics to help relieve pressure along the medial arch. Did add medial heel wedge on the right side. Additionally on both sides to give additional support scaphoid padding was applied. This was fitted to her satisfaction. Additionally, discussed arch strap, this was applied to both sides. Discussed aggressive cryotherapy, stretches, mainly with the arches up. Will plan to have her return in about 3 months to see how this does. Plan to re-ultrasound at that time.  Mort Sawyers, M.D. [place] [facility]

## 2017-11-14 ENCOUNTER — Ambulatory Visit: Payer: BLUE CROSS/BLUE SHIELD | Admitting: Gastroenterology

## 2017-11-25 ENCOUNTER — Encounter: Payer: Self-pay | Admitting: Sports Medicine

## 2017-11-25 ENCOUNTER — Ambulatory Visit (INDEPENDENT_AMBULATORY_CARE_PROVIDER_SITE_OTHER): Payer: BLUE CROSS/BLUE SHIELD | Admitting: Sports Medicine

## 2017-11-25 VITALS — BP 110/80 | Ht 63.0 in | Wt 140.0 lb

## 2017-11-25 DIAGNOSIS — M722 Plantar fascial fibromatosis: Secondary | ICD-10-CM

## 2017-11-25 DIAGNOSIS — Q667 Congenital pes cavus, unspecified foot: Secondary | ICD-10-CM

## 2017-11-25 MED ORDER — METHYLPREDNISOLONE ACETATE 40 MG/ML IJ SUSP
40.0000 mg | Freq: Once | INTRAMUSCULAR | Status: AC
  Administered 2017-11-25: 40 mg via INTRA_ARTICULAR

## 2017-11-25 NOTE — Progress Notes (Signed)
Chief complaint: Right heel pain x 4.5 months  History of present illness: Karen [name] is a 47 year old female who presents to the sports medicine office today for follow-up of right heel pain.  She was seen here about 6 weeks ago back on 10/14/17 for right heel pain.  She was diagnosed with plantar fasciitis of the right foot.  At last appointment, did make changes to her custom orthotics to help relieve pressure along the medial arch.  We added a medial heel wedge on the right side and added scaphoid padding to both sides.  Arch strap was also applied.  She has been doing home exercise program focusing on stretching and strengthening the plantar fascia, as well as aggressive cryotherapy.  Unfortunately, she reports of no improvement in symptoms.  She reports that symptoms acutely worsened last week.  She does not report of any specific inciting incident, trauma, or injury to explain the worsening of symptoms.  She does not report of any numbness, tingling, burning paresthesias in her right foot or ankle.  She reports pain is worse first thing in the morning. Describes pain as sharp, stabbing pain.  Review of systems:  As stated above  Interval past medical history, surgical history, family history, and social history obtained and unchanged. No chronic medical problems, no current tobacco use, no surgeries involving right foot, family history noncontributory to chief complaint.  Physical exam: Vital signs are reviewed and are documented in the chart Gen.: Alert, oriented, appears stated age, in no apparent distress HEENT: Moist oral mucosa Respiratory: Normal respirations, able to speak in full sentences Cardiac: Regular rate, distal pulses 2+ Integumentary: No rashes on visible skin:  Neurologic: Strength 5/5, sensation 2+ in bilateral lower extremities Psych: Normal affect, mood is described as good Musculoskeletal: Inspection of right foot reveals no obvious deformity or muscle atrophy, no warmth,  erythema, ecchymosis, or effusion, she is specifically point tender to palpation at the plantar fascia where it's calcaneal insertion on medial aspect, not as much pain today with dorsiflexion like she had at last appointment, no tenderness over medial lateral malleolus, no tenderness over Achilles or retrocalcaneal bursa, she has full ankle range of motion, she does have congenitally high pes cavus with splaying of the first and second toes  Procedure: After written informed consent signed and obtained, and benefit of pain relief and risk of bleeding, infection, and steroid failure discussed, Karen [name] agreed to proceed with cortisone injection into the right plantar fascia.  After timeout, her right heel was cleaned with Betadine and alcohol wipes.  Ethyl chloride was used as topical anesthetic spray.  Using 1.5 cc 1% lidocaine without epinephrine and 1 cc of 40 mg/mL Depo-Medrol on a 25-gauge 1.5 inch needle, her right plantar fascia was injected from a medial approach, about a 1cm superior from the plantar fascial membrane. She did not have any bleeding afterwards.  No complications noted from procedure.  Assessment and plan: 1. Right plantar fasciitis 2. History of left plantar fasciitis 3. Bilateral congenital pes cavus  Plan: Unfortunately, Karen [name] did not have any relief of symptoms with conservative management for right plantar fasciitis.  After discussing option of cortisone injection today into the plantar fascia, she is agreeable to this.  Injection done as noted above without any complications noted.  Discussed taking things easy over the next 3 days as the cortisone can soften the ligaments and the fascial layer.  Discussed being off her feet as much as possible.  Discussed aggressive cryotherapy, using Tylenol for pain  during the next few days.  After 3 days, discussed that she can return to doing plantar fascial stretching and strengthening exercises.  Discussed to continue with the same  orthotics and padding.  We will plan to have her return in about 4-6 weeks to see how she does or sooner   Mort Sawyers, M.D. [facility]

## 2017-12-06 DIAGNOSIS — J0100 Acute maxillary sinusitis, unspecified: Secondary | ICD-10-CM | POA: Diagnosis not present

## 2017-12-16 ENCOUNTER — Encounter: Payer: Self-pay | Admitting: Gastroenterology

## 2017-12-16 ENCOUNTER — Ambulatory Visit (INDEPENDENT_AMBULATORY_CARE_PROVIDER_SITE_OTHER): Payer: BLUE CROSS/BLUE SHIELD | Admitting: Gastroenterology

## 2017-12-16 VITALS — BP 112/62 | HR 64 | Ht 63.0 in | Wt 145.8 lb

## 2017-12-16 DIAGNOSIS — Z800 Family history of malignant neoplasm of digestive organs: Secondary | ICD-10-CM | POA: Diagnosis not present

## 2017-12-16 MED ORDER — PEG 3350-KCL-NA BICARB-NACL 420 G PO SOLR: 4000.0000 mL | Freq: Once | ORAL | 0 refills | Status: AC

## 2017-12-16 NOTE — Patient Instructions (Addendum)
You will be set up for a colonoscopy for FH of colon cancer.  Normal BMI (Body Mass Index- based on height and weight) is between 19 and 25. Your BMI today is Body mass index is 25.83 kg/m. Marland Kitchen Please consider follow up  regarding your BMI with your Primary Care Provider.

## 2017-12-16 NOTE — Progress Notes (Signed)
HPI: This is a  very pleasan 47 year old woman t who was referred to me by Dian Queen, MD  to evaluate family history of colon cancer.    Chief complaint is family history of colon cancer Her 45 year old sister was just diagnosed with colon cancer. She underwent surgery and is now having chemotherapy.  Her care is in [place].  Patient herself has never had any issues with her bowels that she is aware of.  No bleeding, no significant changes in her bowels.  No abdominal pains.  She had an open Appendectomy about 20 years ago    Old Data Reviewed: None    Review of systems: Pertinent positive and negative review of systems were noted in the above HPI section. All other review negative.   History reviewed. No pertinent past medical history.  Past Surgical History:  Procedure Laterality Date  . APPENDECTOMY    . DIAGNOSTIC LAPAROSCOPY    . DILITATION & CURRETTAGE/HYSTROSCOPY WITH HYDROTHERMAL ABLATION  12/05/2014   Procedure: DILATATION & CURETTAGE/HYSTEROSCOPY WITH HYDROTHERMAL ABLATION;  Surgeon: Cyril Mourning, MD;  Location: [facility];  Service: Gynecology;;  . HYSTEROSCOPY W/D&C  12/05/2014   Procedure: DILATATION AND CURETTAGE [place];  Surgeon: Cyril Mourning, MD;  Location: [facility];  Service: Gynecology;;    Current Outpatient Medications  Medication Sig Dispense Refill  . amoxicillin (AMOXIL) 875 MG tablet   0  . buPROPion (WELLBUTRIN XL) 300 MG 24 hr tablet Take 300 mg by mouth daily.    Marland Kitchen ESTRACE VAGINAL 0.1 MG/GM vaginal cream APPLY 1/2 APPLICATORFUL PER VAGINA 3 TIMES PER WEEK AS NEEDED.  3  . ibuprofen (ADVIL,MOTRIN) 200 MG tablet Take 800 mg by mouth every 6 (six) hours as needed for mild pain.    Marland Kitchen loratadine (CLARITIN) 10 MG tablet Take 10 mg by mouth daily.    Marland Kitchen oseltamivir (TAMIFLU) 75 MG capsule   0   Current Facility-Administered Medications  Medication Dose Route Frequency Provider Last Rate Last Dose  . methylPREDNISolone acetate  (DEPO-MEDROL) injection 40 mg  40 mg Intra-articular Once Rosemarie Ax, MD        Allergies as of 12/16/2017 - Review Complete 12/16/2017  Allergen Reaction Noted  . Augmentin [amoxicillin-pot clavulanate] Other (See Comments) 11/24/2014  . Clindamycin/lincomycin Other (See Comments) 11/24/2014    Family History  Problem Relation Age of Onset  . Colon cancer Sister 22    Social History   Socioeconomic History  . Marital status: Married    Spouse name: Not on file  . Number of children: Not on file  . Years of education: Not on file  . Highest education level: Not on file  Social Needs  . Financial resource strain: Not on file  . Food insecurity - worry: Not on file  . Food insecurity - inability: Not on file  . Transportation needs - medical: Not on file  . Transportation needs - non-medical: Not on file  Occupational History  . Not on file  Tobacco Use  . Smoking status: Never Smoker  . Smokeless tobacco: Never Used  Substance and Sexual Activity  . Alcohol use: No  . Drug use: No  . Sexual activity: No  Other Topics Concern  . Not on file  Social History Narrative  . Not on file     Physical Exam: BP 112/62   Pulse 64   Ht 5\' 3"  (1.6 m)   Wt 145 lb 12.8 oz (66.1 kg)   BMI 25.83 kg/m  Constitutional:  generally well-appearing Psychiatric: alert and oriented x3 Eyes: extraocular movements intact Mouth: oral pharynx moist, no lesions Neck: supple no lymphadenopathy Cardiovascular: heart regular rate and rhythm Lungs: clear to auscultation bilaterally Abdomen: soft, nontender, nondistended, no obvious ascites, no peritoneal signs, normal bowel sounds Extremities: no lower extremity edema bilaterally Skin: no lesions on visible extremities   Assessment and plan: 47 y.o. female with family history of colon cancer in her sister  I recommended colonoscopy for colon cancer screening at her soonest convenience.  She understands that even if it is  perfectly normal she will need colon cancer screening with colonoscopy about every 5 years.  Between then she will not need screening test for colon cancer.  We discussed the risks including bleeding, perforation, missing a cancer.  She understands and wishes to proceed.  I see no reason for any further blood tests or imaging studies prior to then.    Please see the "Patient Instructions" section for addition details about the plan.   Owens Loffler, MD [facility] 12/16/2017, 8:42 AM  Cc: Dian Queen, MD

## 2017-12-23 ENCOUNTER — Ambulatory Visit: Payer: BLUE CROSS/BLUE SHIELD | Admitting: Sports Medicine

## 2018-01-22 ENCOUNTER — Encounter: Payer: Self-pay | Admitting: Podiatry

## 2018-01-22 ENCOUNTER — Ambulatory Visit (INDEPENDENT_AMBULATORY_CARE_PROVIDER_SITE_OTHER): Payer: BLUE CROSS/BLUE SHIELD | Admitting: Podiatry

## 2018-01-22 ENCOUNTER — Ambulatory Visit (INDEPENDENT_AMBULATORY_CARE_PROVIDER_SITE_OTHER): Payer: BLUE CROSS/BLUE SHIELD

## 2018-01-22 DIAGNOSIS — M722 Plantar fascial fibromatosis: Secondary | ICD-10-CM | POA: Diagnosis not present

## 2018-01-22 MED ORDER — DICLOFENAC SODIUM 75 MG PO TBEC: 75.0000 mg | DELAYED_RELEASE_TABLET | Freq: Two times a day (BID) | ORAL | 2 refills | Status: AC

## 2018-01-22 MED ORDER — TRIAMCINOLONE ACETONIDE 10 MG/ML IJ SUSP
10.0000 mg | Freq: Once | INTRAMUSCULAR | Status: AC
  Administered 2018-01-22: 10 mg

## 2018-01-22 NOTE — Progress Notes (Signed)
right

## 2018-01-22 NOTE — Progress Notes (Signed)
Subjective:   Patient ID: Karen [name], female   DOB: 47 y.o.   MRN: [id number]   HPI Patient presents stating I had a lot of pain in my right heel times 6 months and I have pain in my left heel also but that gradually resolved and I did have a previous injection in the left heel which I do not think helping very much.  Patient does not smoke and likes to be active   Review of Systems  All other systems reviewed and are negative.       Objective:  Physical Exam  Constitutional: She appears well-developed and well-nourished.  Cardiovascular: Intact distal pulses.  Pulmonary/Chest: Effort normal.  Musculoskeletal: Normal range of motion.  Neurological: She is alert.  Skin: Skin is warm.  Nursing note and vitals reviewed.   Neurovascular status intact muscle strength adequate range of motion within normal limits with patient found to have exquisite discomfort plantar aspect right heel at the insertional point of the tendon into the calcaneus with inflammation and fluid around the medial band.  The left is minimally sore at the current time and there is a high arch foot type noted bilateral with equinus condition     Assessment:  Inflammatory fasciitis bilateral with the right heel being very sore at the current time and cavus foot structure with equinus condition     Plan:  H&P x-rays reviewed and today I injected the right plantar fascia 3 mg Kenalog 5 mg Xylocaine and went ahead and applied fascial bracing and gave instructions for physical therapy.  Patient may require different orthotic than the soft when she has now will make that decision next week and she will be seen back in the next several weeks when she returns from vacation.  X-ray indicates that there is no spurring and high arch foot type noted

## 2018-01-22 NOTE — Patient Instructions (Signed)
Achilles Tendinitis  with Rehab Achilles tendinitis is a disorder of the Achilles tendon. The Achilles tendon connects the large calf muscles (Gastrocnemius and Soleus) to the heel bone (calcaneus). This tendon is sometimes called the heel cord. It is important for pushing-off and standing on your toes and is important for walking, running, or jumping. Tendinitis is often caused by overuse and repetitive microtrauma. SYMPTOMS  Pain, tenderness, swelling, warmth, and redness may occur over the Achilles tendon even at rest.  Pain with pushing off, or flexing or extending the ankle.  Pain that is worsened after or during activity. CAUSES   Overuse sometimes seen with rapid increase in exercise programs or in sports requiring running and jumping.  Poor physical conditioning (strength and flexibility or endurance).  Running sports, especially training running down hills.  Inadequate warm-up before practice or play or failure to stretch before participation.  Injury to the tendon. PREVENTION   Warm up and stretch before practice or competition.  Allow time for adequate rest and recovery between practices and competition.  Keep up conditioning.  Keep up ankle and leg flexibility.  Improve or keep muscle strength and endurance.  Improve cardiovascular fitness.  Use proper technique.  Use proper equipment (shoes, skates).  To help prevent recurrence, taping, protective strapping, or an adhesive bandage may be recommended for several weeks after healing is complete. PROGNOSIS   Recovery may take weeks to several months to heal.  Longer recovery is expected if symptoms have been prolonged.  Recovery is usually quicker if the inflammation is due to a direct blow as compared with overuse or sudden strain. RELATED COMPLICATIONS   Healing time will be prolonged if the condition is not correctly treated. The injury must be given plenty of time to heal.  Symptoms can reoccur if  activity is resumed too soon.  Untreated, tendinitis may increase the risk of tendon rupture requiring additional time for recovery and possibly surgery. TREATMENT   The first treatment consists of rest anti-inflammatory medication, and ice to relieve the pain.  Stretching and strengthening exercises after resolution of pain will likely help reduce the risk of recurrence. Referral to a physical therapist or athletic trainer for further evaluation and treatment may be helpful.  A walking boot or cast may be recommended to rest the Achilles tendon. This can help break the cycle of inflammation and microtrauma.  Arch supports (orthotics) may be prescribed or recommended by your caregiver as an adjunct to therapy and rest.  Surgery to remove the inflamed tendon lining or degenerated tendon tissue is rarely necessary and has shown less than predictable results. MEDICATION   Nonsteroidal anti-inflammatory medications, such as aspirin and ibuprofen, may be used for pain and inflammation relief. Do not take within 7 days before surgery. Take these as directed by your caregiver. Contact your caregiver immediately if any bleeding, stomach upset, or signs of allergic reaction occur. Other minor pain relievers, such as acetaminophen, may also be used.  Pain relievers may be prescribed as necessary by your caregiver. Do not take prescription pain medication for longer than 4 to 7 days. Use only as directed and only as much as you need.  Cortisone injections are rarely indicated. Cortisone injections may weaken tendons and predispose to rupture. It is better to give the condition more time to heal than to use them. HEAT AND COLD  Cold is used to relieve pain and reduce inflammation for acute and chronic Achilles tendinitis. Cold should be applied for 10 to 15 minutes   every 2 to 3 hours for inflammation and pain and immediately after any activity that aggravates your symptoms. Use ice packs or an ice  massage.  Heat may be used before performing stretching and strengthening activities prescribed by your caregiver. Use a heat pack or a warm soak. SEEK MEDICAL CARE IF:  Symptoms get worse or do not improve in 2 weeks despite treatment.  New, unexplained symptoms develop. Drugs used in treatment may produce side effects.  EXERCISES:  RANGE OF MOTION (ROM) AND STRETCHING EXERCISES - Achilles Tendinitis  These exercises may help you when beginning to rehabilitate your injury. Your symptoms may resolve with or without further involvement from your physician, physical therapist or athletic trainer. While completing these exercises, remember:   Restoring tissue flexibility helps normal motion to return to the joints. This allows healthier, less painful movement and activity.  An effective stretch should be held for at least 30 seconds.  A stretch should never be painful. You should only feel a gentle lengthening or release in the stretched tissue.  STRETCH  Gastroc, Standing   Place hands on wall.  Extend right / left leg, keeping the front knee somewhat bent.  Slightly point your toes inward on your back foot.  Keeping your right / left heel on the floor and your knee straight, shift your weight toward the wall, not allowing your back to arch.  You should feel a gentle stretch in the right / left calf. Hold this position for 10 seconds. Repeat 3 times. Complete this stretch 2 times per day.  STRETCH  Soleus, Standing   Place hands on wall.  Extend right / left leg, keeping the other knee somewhat bent.  Slightly point your toes inward on your back foot.  Keep your right / left heel on the floor, bend your back knee, and slightly shift your weight over the back leg so that you feel a gentle stretch deep in your back calf.  Hold this position for 10 seconds. Repeat 3 times. Complete this stretch 2 times per day.  STRETCH  Gastrocsoleus, Standing  Note: This exercise can place  a lot of stress on your foot and ankle. Please complete this exercise only if specifically instructed by your caregiver.   Place the ball of your right / left foot on a step, keeping your other foot firmly on the same step.  Hold on to the wall or a rail for balance.  Slowly lift your other foot, allowing your body weight to press your heel down over the edge of the step.  You should feel a stretch in your right / left calf.  Hold this position for 10 seconds.  Repeat this exercise with a slight bend in your knee. Repeat 3 times. Complete this stretch 2 times per day.   STRENGTHENING EXERCISES - Achilles Tendinitis These exercises may help you when beginning to rehabilitate your injury. They may resolve your symptoms with or without further involvement from your physician, physical therapist or athletic trainer. While completing these exercises, remember:   Muscles can gain both the endurance and the strength needed for everyday activities through controlled exercises.  Complete these exercises as instructed by your physician, physical therapist or athletic trainer. Progress the resistance and repetitions only as guided.  You may experience muscle soreness or fatigue, but the pain or discomfort you are trying to eliminate should never worsen during these exercises. If this pain does worsen, stop and make certain you are following the directions exactly. If   the pain is still present after adjustments, discontinue the exercise until you can discuss the trouble with your clinician.  STRENGTH - Plantar-flexors   Sit with your right / left leg extended. Holding onto both ends of a rubber exercise band/tubing, loop it around the ball of your foot. Keep a slight tension in the band.  Slowly push your toes away from you, pointing them downward.  Hold this position for 10 seconds. Return slowly, controlling the tension in the band/tubing. Repeat 3 times. Complete this exercise 2 times per day.     STRENGTH - Plantar-flexors   Stand with your feet shoulder width apart. Steady yourself with a wall or table using as little support as needed.  Keeping your weight evenly spread over the width of your feet, rise up on your toes.*  Hold this position for 10 seconds. Repeat 3 times. Complete this exercise 2 times per day.  *If this is too easy, shift your weight toward your right / left leg until you feel challenged. Ultimately, you may be asked to do this exercise with your right / left foot only.  STRENGTH  Plantar-flexors, Eccentric  Note: This exercise can place a lot of stress on your foot and ankle. Please complete this exercise only if specifically instructed by your caregiver.   Place the balls of your feet on a step. With your hands, use only enough support from a wall or rail to keep your balance.  Keep your knees straight and rise up on your toes.  Slowly shift your weight entirely to your right / left toes and pick up your opposite foot. Gently and with controlled movement, lower your weight through your right / left foot so that your heel drops below the level of the step. You will feel a slight stretch in the back of your calf at the end position.  Use the healthy leg to help rise up onto the balls of both feet, then lower weight only on the right / left leg again. Build up to 15 repetitions. Then progress to 3 consecutive sets of 15 repetitions.*  After completing the above exercise, complete the same exercise with a slight knee bend (about 30 degrees). Again, build up to 15 repetitions. Then progress to 3 consecutive sets of 15 repetitions.* Perform this exercise 2 times per day.  *When you easily complete 3 sets of 15, your physician, physical therapist or athletic trainer may advise you to add resistance by wearing a backpack filled with additional weight.  STRENGTH - Plantar Flexors, Seated   Sit on a chair that allows your feet to rest flat on the ground. If  necessary, sit at the edge of the chair.  Keeping your toes firmly on the ground, lift your right / left heel as far as you can without increasing any discomfort in your ankle. Repeat 3 times. Complete this exercise 2 times a day.    Plantar Fasciitis (Heel Spur Syndrome) with Rehab The plantar fascia is a fibrous, ligament-like, soft-tissue structure that spans the bottom of the foot. Plantar fasciitis is a condition that causes pain in the foot due to inflammation of the tissue. SYMPTOMS   Pain and tenderness on the underneath side of the foot.  Pain that worsens with standing or walking. CAUSES  Plantar fasciitis is caused by irritation and injury to the plantar fascia on the underneath side of the foot. Common mechanisms of injury include:  Direct trauma to bottom of the foot.  Damage to a small   nerve that runs under the foot where the main fascia attaches to the heel bone.  Stress placed on the plantar fascia due to bone spurs. RISK INCREASES WITH:   Activities that place stress on the plantar fascia (running, jumping, pivoting, or cutting).  Poor strength and flexibility.  Improperly fitted shoes.  Tight calf muscles.  Flat feet.  Failure to warm-up properly before activity.  Obesity. PREVENTION  Warm up and stretch properly before activity.  Allow for adequate recovery between workouts.  Maintain physical fitness:  Strength, flexibility, and endurance.  Cardiovascular fitness.  Maintain a health body weight.  Avoid stress on the plantar fascia.  Wear properly fitted shoes, including arch supports for individuals who have flat feet.  PROGNOSIS  If treated properly, then the symptoms of plantar fasciitis usually resolve without surgery. However, occasionally surgery is necessary.  RELATED COMPLICATIONS   Recurrent symptoms that may result in a chronic condition.  Problems of the lower back that are caused by compensating for the injury, such as  limping.  Pain or weakness of the foot during push-off following surgery.  Chronic inflammation, scarring, and partial or complete fascia tear, occurring more often from repeated injections.  TREATMENT  Treatment initially involves the use of ice and medication to help reduce pain and inflammation. The use of strengthening and stretching exercises may help reduce pain with activity, especially stretches of the Achilles tendon. These exercises may be performed at home or with a therapist. Your caregiver may recommend that you use heel cups of arch supports to help reduce stress on the plantar fascia. Occasionally, corticosteroid injections are given to reduce inflammation. If symptoms persist for greater than 6 months despite non-surgical (conservative), then surgery may be recommended.   MEDICATION   If pain medication is necessary, then nonsteroidal anti-inflammatory medications, such as aspirin and ibuprofen, or other minor pain relievers, such as acetaminophen, are often recommended.  Do not take pain medication within 7 days before surgery.  Prescription pain relievers may be given if deemed necessary by your caregiver. Use only as directed and only as much as you need.  Corticosteroid injections may be given by your caregiver. These injections should be reserved for the most serious cases, because they may only be given a certain number of times.  HEAT AND COLD  Cold treatment (icing) relieves pain and reduces inflammation. Cold treatment should be applied for 10 to 15 minutes every 2 to 3 hours for inflammation and pain and immediately after any activity that aggravates your symptoms. Use ice packs or massage the area with a piece of ice (ice massage).  Heat treatment may be used prior to performing the stretching and strengthening activities prescribed by your caregiver, physical therapist, or athletic trainer. Use a heat pack or soak the injury in warm water.  SEEK IMMEDIATE MEDICAL  CARE IF:  Treatment seems to offer no benefit, or the condition worsens.  Any medications produce adverse side effects.  EXERCISES- RANGE OF MOTION (ROM) AND STRETCHING EXERCISES - Plantar Fasciitis (Heel Spur Syndrome) These exercises may help you when beginning to rehabilitate your injury. Your symptoms may resolve with or without further involvement from your physician, physical therapist or athletic trainer. While completing these exercises, remember:   Restoring tissue flexibility helps normal motion to return to the joints. This allows healthier, less painful movement and activity.  An effective stretch should be held for at least 30 seconds.  A stretch should never be painful. You should only feel a gentle lengthening   or release in the stretched tissue.  RANGE OF MOTION - Toe Extension, Flexion  Sit with your right / left leg crossed over your opposite knee.  Grasp your toes and gently pull them back toward the top of your foot. You should feel a stretch on the bottom of your toes and/or foot.  Hold this stretch for 10 seconds.  Now, gently pull your toes toward the bottom of your foot. You should feel a stretch on the top of your toes and or foot.  Hold this stretch for 10 seconds. Repeat  times. Complete this stretch 3 times per day.   RANGE OF MOTION - Ankle Dorsiflexion, Active Assisted  Remove shoes and sit on a chair that is preferably not on a carpeted surface.  Place right / left foot under knee. Extend your opposite leg for support.  Keeping your heel down, slide your right / left foot back toward the chair until you feel a stretch at your ankle or calf. If you do not feel a stretch, slide your bottom forward to the edge of the chair, while still keeping your heel down.  Hold this stretch for 10 seconds. Repeat 3 times. Complete this stretch 2 times per day.   STRETCH  Gastroc, Standing  Place hands on wall.  Extend right / left leg, keeping the front knee  somewhat bent.  Slightly point your toes inward on your back foot.  Keeping your right / left heel on the floor and your knee straight, shift your weight toward the wall, not allowing your back to arch.  You should feel a gentle stretch in the right / left calf. Hold this position for 10 seconds. Repeat 3 times. Complete this stretch 2 times per day.  STRETCH  Soleus, Standing  Place hands on wall.  Extend right / left leg, keeping the other knee somewhat bent.  Slightly point your toes inward on your back foot.  Keep your right / left heel on the floor, bend your back knee, and slightly shift your weight over the back leg so that you feel a gentle stretch deep in your back calf.  Hold this position for 10 seconds. Repeat 3 times. Complete this stretch 2 times per day.  STRETCH  Gastrocsoleus, Standing  Note: This exercise can place a lot of stress on your foot and ankle. Please complete this exercise only if specifically instructed by your caregiver.   Place the ball of your right / left foot on a step, keeping your other foot firmly on the same step.  Hold on to the wall or a rail for balance.  Slowly lift your other foot, allowing your body weight to press your heel down over the edge of the step.  You should feel a stretch in your right / left calf.  Hold this position for 10 seconds.  Repeat this exercise with a slight bend in your right / left knee. Repeat 3 times. Complete this stretch 2 times per day.   STRENGTHENING EXERCISES - Plantar Fasciitis (Heel Spur Syndrome)  These exercises may help you when beginning to rehabilitate your injury. They may resolve your symptoms with or without further involvement from your physician, physical therapist or athletic trainer. While completing these exercises, remember:   Muscles can gain both the endurance and the strength needed for everyday activities through controlled exercises.  Complete these exercises as instructed by  your physician, physical therapist or athletic trainer. Progress the resistance and repetitions only as guided.  STRENGTH -   Towel Curls  Sit in a chair positioned on a non-carpeted surface.  Place your foot on a towel, keeping your heel on the floor.  Pull the towel toward your heel by only curling your toes. Keep your heel on the floor. Repeat 3 times. Complete this exercise 2 times per day.  STRENGTH - Ankle Inversion  Secure one end of a rubber exercise band/tubing to a fixed object (table, pole). Loop the other end around your foot just before your toes.  Place your fists between your knees. This will focus your strengthening at your ankle.  Slowly, pull your big toe up and in, making sure the band/tubing is positioned to resist the entire motion.  Hold this position for 10 seconds.  Have your muscles resist the band/tubing as it slowly pulls your foot back to the starting position. Repeat 3 times. Complete this exercises 2 times per day.  Document Released: 09/23/2005 Document Revised: 12/16/2011 Document Reviewed: 01/05/2009 [employer] Patient Information 2014 [employer]  

## 2018-02-05 ENCOUNTER — Encounter: Payer: Self-pay | Admitting: Podiatry

## 2018-02-05 ENCOUNTER — Encounter: Payer: Self-pay | Admitting: Gastroenterology

## 2018-02-05 ENCOUNTER — Ambulatory Visit (INDEPENDENT_AMBULATORY_CARE_PROVIDER_SITE_OTHER): Payer: BLUE CROSS/BLUE SHIELD | Admitting: Podiatry

## 2018-02-05 DIAGNOSIS — M722 Plantar fascial fibromatosis: Secondary | ICD-10-CM | POA: Diagnosis not present

## 2018-02-05 NOTE — Progress Notes (Signed)
Subjective:   Patient ID: Karen [name], female   DOB: 47 y.o.   MRN: [id number]   HPI Patient presents stating she is doing much better with minimal discomfort and states that she is wearing orthotics now   ROS      Objective:  Physical Exam  Neurovascular status intact with significant diminishment of discomfort plantar fascial right over left with diminished fluid buildup and still a relatively diminished fat     Assessment:  Fasciitis right over left but improving but still present     Plan:  H&P condition reviewed and recommended that we continue with conservative stretching exercises shoe gear modifications and wearing a elevated heel at home.  Patient will be seen back if symptoms persist may require a new orthotic or other treatments depending on how she does

## 2018-02-13 ENCOUNTER — Other Ambulatory Visit: Payer: Self-pay

## 2018-02-13 ENCOUNTER — Encounter: Payer: Self-pay | Admitting: Gastroenterology

## 2018-02-13 ENCOUNTER — Ambulatory Visit (AMBULATORY_SURGERY_CENTER): Payer: BLUE CROSS/BLUE SHIELD | Admitting: Gastroenterology

## 2018-02-13 VITALS — BP 95/58 | HR 67 | Temp 98.0°F | Resp 16 | Ht 63.0 in | Wt 145.0 lb

## 2018-02-13 DIAGNOSIS — Z1211 Encounter for screening for malignant neoplasm of colon: Secondary | ICD-10-CM | POA: Diagnosis present

## 2018-02-13 DIAGNOSIS — D129 Benign neoplasm of anus and anal canal: Secondary | ICD-10-CM

## 2018-02-13 DIAGNOSIS — K621 Rectal polyp: Secondary | ICD-10-CM

## 2018-02-13 DIAGNOSIS — D128 Benign neoplasm of rectum: Secondary | ICD-10-CM

## 2018-02-13 DIAGNOSIS — Z800 Family history of malignant neoplasm of digestive organs: Secondary | ICD-10-CM | POA: Diagnosis not present

## 2018-02-13 MED ORDER — SODIUM CHLORIDE 0.9 % IV SOLN: 500.0000 mL | Freq: Once | INTRAVENOUS | Status: DC

## 2018-02-13 NOTE — Progress Notes (Signed)
Report given to PACU, vss 

## 2018-02-13 NOTE — Patient Instructions (Signed)
YOU HAD AN ENDOSCOPIC PROCEDURE TODAY AT THE [facility]:   Refer to the procedure report that was given to you for any specific questions about what was found during the examination.  If the procedure report does not answer your questions, please call your gastroenterologist to clarify.  If you requested that your care partner not be given the details of your procedure findings, then the procedure report has been included in a sealed envelope for you to review at your convenience later.  YOU SHOULD EXPECT: Some feelings of bloating in the abdomen. Passage of more gas than usual.  Walking can help get rid of the air that was put into your GI tract during the procedure and reduce the bloating. If you had a lower endoscopy (such as a colonoscopy or flexible sigmoidoscopy) you may notice spotting of blood in your stool or on the toilet paper. If you underwent a bowel prep for your procedure, you may not have a normal bowel movement for a few days.  Please Note:  You might notice some irritation and congestion in your nose or some drainage.  This is from the oxygen used during your procedure.  There is no need for concern and it should clear up in a day or so.  SYMPTOMS TO REPORT IMMEDIATELY:   Following lower endoscopy (colonoscopy or flexible sigmoidoscopy):  Excessive amounts of blood in the stool  Significant tenderness or worsening of abdominal pains  Swelling of the abdomen that is new, acute  Fever of 100F or higher  For urgent or emergent issues, a gastroenterologist can be reached at any hour by calling [phone].   DIET:  We do recommend a small meal at first, but then you may proceed to your regular diet.  Drink plenty of fluids but you should avoid alcoholic beverages for 24 hours.  ACTIVITY:  You should plan to take it easy for the rest of today and you should NOT DRIVE or use heavy machinery until tomorrow (because of the sedation medicines used during the test).     FOLLOW UP: Our staff will call the number listed on your records the next business day following your procedure to check on you and address any questions or concerns that you may have regarding the information given to you following your procedure. If we do not reach you, we will leave a message.  However, if you are feeling well and you are not experiencing any problems, there is no need to return our call.  We will assume that you have returned to your regular daily activities without incident.  If any biopsies were taken you will be contacted by phone or by letter within the next 1-3 weeks.  Please call us at [phone] if you have not heard about the biopsies in 3 weeks.   Await for biopsy results to determine next repeat Colonoscopy screening Polyps (handout given)  SIGNATURES/CONFIDENTIALITY: You and/or your care partner have signed paperwork which will be entered into your electronic medical record.  These signatures attest to the fact that that the information above on your After Visit Summary has been reviewed and is understood.  Full responsibility of the confidentiality of this discharge information lies with you and/or your care-partner. 

## 2018-02-13 NOTE — Op Note (Signed)
[facility] Patient Name: Karen [name] Procedure Date: 02/13/2018 10:31 AM MRN: [id number] Endoscopist: Milus Banister , MD Age: 47 Referring MD:  Date of Birth: April 01, 1971 Gender: Female Account #: [id number] Procedure:                Colonoscopy Indications:              Screening in patient at increased risk: Family                            history of 1st-degree relative with colorectal                            cancer before age 71 years (sister with colon                            cancer in her 56s) Medicines:                Monitored Anesthesia Care Procedure:                Pre-Anesthesia Assessment:                           - Prior to the procedure, a History and Physical                            was performed, and patient medications and                            allergies were reviewed. The patient's tolerance of                            previous anesthesia was also reviewed. The risks                            and benefits of the procedure and the sedation                            options and risks were discussed with the patient.                            All questions were answered, and informed consent                            was obtained. Prior Anticoagulants: The patient has                            taken no previous anticoagulant or antiplatelet                            agents. ASA Grade Assessment: II - A patient with                            mild systemic disease. After reviewing the risks  and benefits, the patient was deemed in                            satisfactory condition to undergo the procedure.                           After obtaining informed consent, the colonoscope                            was passed under direct vision. Throughout the                            procedure, the patient's blood pressure, pulse, and                            oxygen saturations were monitored continuously. The                             Colonoscope was introduced through the anus and                            advanced to the the cecum, identified by                            appendiceal orifice and ileocecal valve. The                            colonoscopy was performed without difficulty. The                            patient tolerated the procedure well. The quality                            of the bowel preparation was good. The ileocecal                            valve, appendiceal orifice, and rectum were                            photographed. Scope In: 10:38:48 AM Scope Out: 10:50:18 AM Scope Withdrawal Time: 0 hours 7 minutes 23 seconds  Total Procedure Duration: 0 hours 11 minutes 30 seconds  Findings:                 A 4 mm polyp was found in the rectum. The polyp was                            sessile. The polyp was removed with a cold snare.                            Resection and retrieval were complete.                           The exam was otherwise without abnormality on  direct and retroflexion views. Complications:            No immediate complications. Estimated blood loss:                            None. Estimated Blood Loss:     Estimated blood loss: none. Impression:               - One 4 mm polyp in the rectum, removed with a cold                            snare. Resected and retrieved.                           - The examination was otherwise normal on direct                            and retroflexion views. Recommendation:           - Patient has a contact number available for                            emergencies. The signs and symptoms of potential                            delayed complications were discussed with the                            patient. Return to normal activities tomorrow.                            Written discharge instructions were provided to the                            patient.                            - Resume previous diet.                           - Continue present medications.                           You will receive a letter within 2-3 weeks with the                            pathology results and my final recommendations.                           If the polyp(s) is proven to be 'pre-cancerous' on                            pathology, you will need repeat colonoscopy in 5                            years. Milus Banister, MD 02/13/2018 10:52:18 AM This  report has been signed electronically.

## 2018-02-13 NOTE — Progress Notes (Signed)
Called to room to assist during endoscopic procedure.  Patient ID and intended procedure confirmed with present staff. Received instructions for my participation in the procedure from the performing physician.  

## 2018-02-16 ENCOUNTER — Telehealth: Payer: Self-pay

## 2018-02-16 ENCOUNTER — Telehealth: Payer: Self-pay | Admitting: *Deleted

## 2018-02-16 NOTE — Telephone Encounter (Signed)
Attempted to reach pt. With follow-up call following endoscopic procedure 02/13/2018.  LM on pt voice mail to call if she has any questions or concerns.

## 2018-02-16 NOTE — Telephone Encounter (Signed)
Message left on voice mail.

## 2018-02-19 ENCOUNTER — Encounter: Payer: Self-pay | Admitting: Gastroenterology

## 2018-05-04 ENCOUNTER — Encounter: Payer: Self-pay | Admitting: Podiatry

## 2018-05-04 ENCOUNTER — Ambulatory Visit (INDEPENDENT_AMBULATORY_CARE_PROVIDER_SITE_OTHER): Payer: BLUE CROSS/BLUE SHIELD | Admitting: Podiatry

## 2018-05-04 DIAGNOSIS — M779 Enthesopathy, unspecified: Secondary | ICD-10-CM

## 2018-05-04 DIAGNOSIS — M7752 Other enthesopathy of left foot: Secondary | ICD-10-CM

## 2018-05-04 DIAGNOSIS — M7751 Other enthesopathy of right foot: Secondary | ICD-10-CM | POA: Diagnosis not present

## 2018-05-04 DIAGNOSIS — M722 Plantar fascial fibromatosis: Secondary | ICD-10-CM | POA: Diagnosis not present

## 2018-05-04 NOTE — Progress Notes (Signed)
Subjective:   Patient ID: Karen [name], female   DOB: 47 y.o.   MRN: [id number]   HPI Patient states she needs new orthotics as the other ones are not acceptable and heels feeling quite a bit better and she is getting some pain in the outside of the right foot   ROS      Objective:  Physical Exam  Neurovascular status intact with patient having significant diminishment of discomfort plantar aspect right heel with pain still present upon deep palpation.  Patient is quite a bit of inflammation around the fifth metatarsal base right with fluid buildup in the area and moderate discomfort with palpation     Assessment:  Doing well post fasciitis treatment right with probable peroneal tendinitis right     Plan:  H&P and discussed both conditions.  At this point I have recommended new orthotics and she is casted scanned for new orthotics and I discussed aggressive ice with the outside of the right foot and if symptoms do not improve or get a need to consider injection treatment.  Patient will be seen back was also given education on how she is regained her balance better after having dealt with the plantar fasciitis for period of time

## 2018-05-25 ENCOUNTER — Ambulatory Visit (INDEPENDENT_AMBULATORY_CARE_PROVIDER_SITE_OTHER): Payer: BLUE CROSS/BLUE SHIELD | Admitting: Podiatry

## 2018-05-25 ENCOUNTER — Ambulatory Visit (INDEPENDENT_AMBULATORY_CARE_PROVIDER_SITE_OTHER): Payer: BLUE CROSS/BLUE SHIELD

## 2018-05-25 ENCOUNTER — Encounter: Payer: Self-pay | Admitting: Podiatry

## 2018-05-25 DIAGNOSIS — S92302A Fracture of unspecified metatarsal bone(s), left foot, initial encounter for closed fracture: Secondary | ICD-10-CM | POA: Diagnosis not present

## 2018-05-25 DIAGNOSIS — S99922A Unspecified injury of left foot, initial encounter: Secondary | ICD-10-CM

## 2018-05-25 DIAGNOSIS — R600 Localized edema: Secondary | ICD-10-CM

## 2018-05-25 NOTE — Progress Notes (Signed)
Subjective:   Patient ID: Karen [name], female   DOB: 47 y.o.   MRN: [id number]   HPI Patient fell on her left foot and broke a bone in his back wearing a surgical shoe.  States is been swollen and painful and she presents from the emergency room   ROS      Objective:  Physical Exam  Neurovascular status intact with patient found to have significant fracture of the base of the second metatarsal left with no indications that it is through the joint with mild displacement noted.  Patient has good digital perfusion is well oriented x3     Assessment:  Fracture of the base of the second metatarsal left which may possibly extend into the third metatarsal     Plan:  H&P x-ray reviewed and at this point I have recommended continued immobilization and I applied an Unna boot Ace bandage continued surgical shoe and she will be seen back in 3 weeks or earlier if needed  X-ray indicates fracture of the base the second metatarsal left which is not an here to involve the joint space with slight lateral displacement of the second metatarsal shaft

## 2018-05-27 ENCOUNTER — Ambulatory Visit: Payer: BLUE CROSS/BLUE SHIELD | Admitting: Podiatry

## 2018-05-28 ENCOUNTER — Other Ambulatory Visit: Payer: BLUE CROSS/BLUE SHIELD | Admitting: Orthotics

## 2018-06-01 ENCOUNTER — Other Ambulatory Visit: Payer: BLUE CROSS/BLUE SHIELD | Admitting: Orthotics

## 2018-06-15 ENCOUNTER — Ambulatory Visit (INDEPENDENT_AMBULATORY_CARE_PROVIDER_SITE_OTHER): Payer: BLUE CROSS/BLUE SHIELD

## 2018-06-15 ENCOUNTER — Ambulatory Visit: Payer: BLUE CROSS/BLUE SHIELD | Admitting: Orthotics

## 2018-06-15 ENCOUNTER — Other Ambulatory Visit: Payer: Self-pay | Admitting: Podiatry

## 2018-06-15 ENCOUNTER — Ambulatory Visit (INDEPENDENT_AMBULATORY_CARE_PROVIDER_SITE_OTHER): Payer: BLUE CROSS/BLUE SHIELD | Admitting: Podiatry

## 2018-06-15 ENCOUNTER — Encounter: Payer: Self-pay | Admitting: Podiatry

## 2018-06-15 DIAGNOSIS — S92302D Fracture of unspecified metatarsal bone(s), left foot, subsequent encounter for fracture with routine healing: Secondary | ICD-10-CM

## 2018-06-15 DIAGNOSIS — S99922D Unspecified injury of left foot, subsequent encounter: Secondary | ICD-10-CM | POA: Diagnosis not present

## 2018-06-15 DIAGNOSIS — S99922A Unspecified injury of left foot, initial encounter: Secondary | ICD-10-CM

## 2018-06-15 DIAGNOSIS — M722 Plantar fascial fibromatosis: Secondary | ICD-10-CM

## 2018-06-15 DIAGNOSIS — M779 Enthesopathy, unspecified: Secondary | ICD-10-CM

## 2018-06-15 NOTE — Progress Notes (Signed)
Patient came in today to pick up custom made foot orthotics.  The goals were accomplished and the patient reported no dissatisfaction with said orthotics.  Patient was advised of breakin period and how to report any issues. 

## 2018-06-16 NOTE — Progress Notes (Signed)
Subjective:   Patient ID: Karen [name], female   DOB: 47 y.o.   MRN: [id number]   HPI Patient states that her left foot is improving with mild discomfort and she is been able to go without the boot for periods of time.  Patient is still not active and still has soreness if she walks a lot   ROS      Objective:  Physical Exam  Neurovascular status intact with patient's left second metatarsal still moderately swollen around the second metatarsal base with discomfort with deep palpation with mild discomfort also in the right heel at the insertional point calcaneus     Assessment:  Doing well with fracture of the base of the second metatarsal left with improved plantar fascial condition right     Plan:  H&P condition reviewed and for the left I recommended continued ice continued rigid bottom shoes with gradual increase in activity over the next 4 weeks.  Patient understands this fracture is not completely healed and will need to be watched but overall I think it is improving and is showing multiple signs of healing  X-rays indicate there is a fracture base the second metatarsal left with possible joint involvement which appears stable and does show multiple signs of secondary bone healing

## 2018-06-29 ENCOUNTER — Telehealth: Payer: Self-pay | Admitting: Podiatry

## 2018-06-29 NOTE — Telephone Encounter (Signed)
I told pt that to gradually begin wearing the athletic shoe outside on firm level surfaces, or inside familiar places other than home and then gradually increase from there. Pt states understanding.

## 2018-06-29 NOTE — Telephone Encounter (Signed)
I saw Dr. Paulla Dolly a few weeks ago and he is treating me for a broken foot. He told me I could gradually start going to steady tennis shoes and that sort of thing. I've pretty much worn the boot mostly outside of the house and then my tennis shoes while at home. I didn't know if he wanted me to continue to wear my tennis shoes more outside the house? I just wondered how often he thinks I should wear my tennis shoe instead of the boot? I can be reached at [phone]. Thank you.

## 2018-07-20 ENCOUNTER — Ambulatory Visit (INDEPENDENT_AMBULATORY_CARE_PROVIDER_SITE_OTHER): Payer: BLUE CROSS/BLUE SHIELD | Admitting: Podiatry

## 2018-07-20 ENCOUNTER — Encounter: Payer: Self-pay | Admitting: Podiatry

## 2018-07-20 ENCOUNTER — Ambulatory Visit (INDEPENDENT_AMBULATORY_CARE_PROVIDER_SITE_OTHER): Payer: BLUE CROSS/BLUE SHIELD

## 2018-07-20 DIAGNOSIS — S99922D Unspecified injury of left foot, subsequent encounter: Secondary | ICD-10-CM | POA: Diagnosis not present

## 2018-07-20 DIAGNOSIS — M722 Plantar fascial fibromatosis: Secondary | ICD-10-CM

## 2018-07-21 NOTE — Progress Notes (Signed)
Subjective:   Patient ID: Karen [name], female   DOB: 47 y.o.   MRN: [id number]   HPI Patient states the left foot continues to improve but is somewhat sore if she walks too much in the right heel has been bothering her some   ROS      Objective:  Physical Exam  Neurovascular status intact with patient's left forefoot improving but still painful upon deep palpation there is mild discomfort in the right plantar fashion     Assessment:  Fracture base of second metatarsal left which continues to heal with mild discomfort still noted and right foot gradual increase in foot fasciitis as he she is probably from walking differently     Plan:  Reviewed both conditions and x-ray of left and at this point we will let her return to hopeful normal activity and use rigid bottom shoes and will be seen back as needed but it should hopefully going to uneventfully x-ray  X-ray indicates that there is continued healing base the second metatarsal left with not complete healing but significantly better than previously

## 2018-08-19 DIAGNOSIS — Z6822 Body mass index (BMI) 22.0-22.9, adult: Secondary | ICD-10-CM | POA: Diagnosis not present

## 2018-08-19 DIAGNOSIS — Z1231 Encounter for screening mammogram for malignant neoplasm of breast: Secondary | ICD-10-CM | POA: Diagnosis not present

## 2018-08-19 DIAGNOSIS — Z01419 Encounter for gynecological examination (general) (routine) without abnormal findings: Secondary | ICD-10-CM | POA: Diagnosis not present

## 2018-12-18 DIAGNOSIS — M791 Myalgia, unspecified site: Secondary | ICD-10-CM | POA: Diagnosis not present

## 2019-11-29 DIAGNOSIS — F419 Anxiety disorder, unspecified: Secondary | ICD-10-CM | POA: Insufficient documentation

## 2019-11-29 DIAGNOSIS — Z01419 Encounter for gynecological examination (general) (routine) without abnormal findings: Secondary | ICD-10-CM | POA: Diagnosis not present

## 2019-11-29 DIAGNOSIS — Z6826 Body mass index (BMI) 26.0-26.9, adult: Secondary | ICD-10-CM | POA: Diagnosis not present

## 2019-11-29 DIAGNOSIS — N390 Urinary tract infection, site not specified: Secondary | ICD-10-CM | POA: Diagnosis not present

## 2019-11-29 DIAGNOSIS — Z1231 Encounter for screening mammogram for malignant neoplasm of breast: Secondary | ICD-10-CM | POA: Diagnosis not present

## 2019-11-29 DIAGNOSIS — B009 Herpesviral infection, unspecified: Secondary | ICD-10-CM | POA: Insufficient documentation

## 2019-12-07 DIAGNOSIS — Z1152 Encounter for screening for COVID-19: Secondary | ICD-10-CM | POA: Diagnosis not present

## 2020-05-29 DIAGNOSIS — Z1159 Encounter for screening for other viral diseases: Secondary | ICD-10-CM | POA: Diagnosis not present

## 2020-05-29 DIAGNOSIS — Z20828 Contact with and (suspected) exposure to other viral communicable diseases: Secondary | ICD-10-CM | POA: Diagnosis not present

## 2020-05-29 DIAGNOSIS — Z01818 Encounter for other preprocedural examination: Secondary | ICD-10-CM | POA: Diagnosis not present

## 2020-06-29 DIAGNOSIS — H524 Presbyopia: Secondary | ICD-10-CM | POA: Diagnosis not present

## 2020-06-29 DIAGNOSIS — H5203 Hypermetropia, bilateral: Secondary | ICD-10-CM | POA: Diagnosis not present

## 2020-06-29 DIAGNOSIS — H52221 Regular astigmatism, right eye: Secondary | ICD-10-CM | POA: Diagnosis not present

## 2020-08-10 DIAGNOSIS — Z13220 Encounter for screening for lipoid disorders: Secondary | ICD-10-CM | POA: Diagnosis not present

## 2020-08-10 DIAGNOSIS — Z0000 Encounter for general adult medical examination without abnormal findings: Secondary | ICD-10-CM | POA: Diagnosis not present

## 2020-10-11 DIAGNOSIS — H40013 Open angle with borderline findings, low risk, bilateral: Secondary | ICD-10-CM | POA: Diagnosis not present

## 2020-10-12 DIAGNOSIS — H40013 Open angle with borderline findings, low risk, bilateral: Secondary | ICD-10-CM | POA: Diagnosis not present

## 2020-10-13 DIAGNOSIS — Z1159 Encounter for screening for other viral diseases: Secondary | ICD-10-CM | POA: Diagnosis not present

## 2020-10-17 DIAGNOSIS — Z1159 Encounter for screening for other viral diseases: Secondary | ICD-10-CM | POA: Diagnosis not present

## 2020-11-29 DIAGNOSIS — Z01419 Encounter for gynecological examination (general) (routine) without abnormal findings: Secondary | ICD-10-CM | POA: Diagnosis not present

## 2020-11-29 DIAGNOSIS — Z1231 Encounter for screening mammogram for malignant neoplasm of breast: Secondary | ICD-10-CM | POA: Diagnosis not present

## 2020-11-29 DIAGNOSIS — Z6827 Body mass index (BMI) 27.0-27.9, adult: Secondary | ICD-10-CM | POA: Diagnosis not present

## 2021-06-29 DIAGNOSIS — M7701 Medial epicondylitis, right elbow: Secondary | ICD-10-CM | POA: Diagnosis not present

## 2021-06-29 DIAGNOSIS — M7702 Medial epicondylitis, left elbow: Secondary | ICD-10-CM | POA: Diagnosis not present

## 2021-07-26 DIAGNOSIS — H40013 Open angle with borderline findings, low risk, bilateral: Secondary | ICD-10-CM | POA: Diagnosis not present

## 2021-09-03 DIAGNOSIS — H16223 Keratoconjunctivitis sicca, not specified as Sjogren's, bilateral: Secondary | ICD-10-CM | POA: Diagnosis not present

## 2021-09-03 DIAGNOSIS — H0288A Meibomian gland dysfunction right eye, upper and lower eyelids: Secondary | ICD-10-CM | POA: Diagnosis not present

## 2021-09-03 DIAGNOSIS — H0288B Meibomian gland dysfunction left eye, upper and lower eyelids: Secondary | ICD-10-CM | POA: Diagnosis not present

## 2021-11-13 DIAGNOSIS — H6981 Other specified disorders of Eustachian tube, right ear: Secondary | ICD-10-CM | POA: Diagnosis not present

## 2021-11-13 DIAGNOSIS — J3489 Other specified disorders of nose and nasal sinuses: Secondary | ICD-10-CM | POA: Diagnosis not present

## 2021-11-13 DIAGNOSIS — H6522 Chronic serous otitis media, left ear: Secondary | ICD-10-CM | POA: Diagnosis not present

## 2021-11-13 DIAGNOSIS — H9011 Conductive hearing loss, unilateral, right ear, with unrestricted hearing on the contralateral side: Secondary | ICD-10-CM | POA: Insufficient documentation

## 2021-11-13 DIAGNOSIS — H6991 Unspecified Eustachian tube disorder, right ear: Secondary | ICD-10-CM | POA: Insufficient documentation

## 2021-11-13 DIAGNOSIS — J392 Other diseases of pharynx: Secondary | ICD-10-CM | POA: Diagnosis not present

## 2022-01-03 DIAGNOSIS — Z6826 Body mass index (BMI) 26.0-26.9, adult: Secondary | ICD-10-CM | POA: Diagnosis not present

## 2022-01-03 DIAGNOSIS — Z01419 Encounter for gynecological examination (general) (routine) without abnormal findings: Secondary | ICD-10-CM | POA: Diagnosis not present

## 2022-01-03 DIAGNOSIS — Z1231 Encounter for screening mammogram for malignant neoplasm of breast: Secondary | ICD-10-CM | POA: Diagnosis not present

## 2022-01-03 DIAGNOSIS — Z124 Encounter for screening for malignant neoplasm of cervix: Secondary | ICD-10-CM | POA: Diagnosis not present

## 2022-01-08 DIAGNOSIS — L2089 Other atopic dermatitis: Secondary | ICD-10-CM | POA: Diagnosis not present

## 2022-01-10 DIAGNOSIS — H9071 Mixed conductive and sensorineural hearing loss, unilateral, right ear, with unrestricted hearing on the contralateral side: Secondary | ICD-10-CM | POA: Diagnosis not present

## 2022-01-17 DIAGNOSIS — H6981 Other specified disorders of Eustachian tube, right ear: Secondary | ICD-10-CM | POA: Diagnosis not present

## 2022-01-17 DIAGNOSIS — H9011 Conductive hearing loss, unilateral, right ear, with unrestricted hearing on the contralateral side: Secondary | ICD-10-CM | POA: Diagnosis not present

## 2022-01-22 DIAGNOSIS — M941 Relapsing polychondritis: Secondary | ICD-10-CM | POA: Diagnosis not present

## 2022-02-05 DIAGNOSIS — M79671 Pain in right foot: Secondary | ICD-10-CM | POA: Diagnosis not present

## 2022-02-06 ENCOUNTER — Ambulatory Visit: Payer: BLUE CROSS/BLUE SHIELD | Admitting: Podiatry

## 2022-02-18 DIAGNOSIS — M79671 Pain in right foot: Secondary | ICD-10-CM | POA: Diagnosis not present

## 2022-03-07 DIAGNOSIS — S92354A Nondisplaced fracture of fifth metatarsal bone, right foot, initial encounter for closed fracture: Secondary | ICD-10-CM | POA: Diagnosis not present

## 2022-03-14 DIAGNOSIS — Z13820 Encounter for screening for osteoporosis: Secondary | ICD-10-CM | POA: Diagnosis not present

## 2022-03-21 DIAGNOSIS — H9011 Conductive hearing loss, unilateral, right ear, with unrestricted hearing on the contralateral side: Secondary | ICD-10-CM | POA: Diagnosis not present

## 2022-03-21 DIAGNOSIS — M941 Relapsing polychondritis: Secondary | ICD-10-CM | POA: Diagnosis not present

## 2022-03-21 DIAGNOSIS — H6981 Other specified disorders of Eustachian tube, right ear: Secondary | ICD-10-CM | POA: Diagnosis not present

## 2022-04-15 DIAGNOSIS — S92354D Nondisplaced fracture of fifth metatarsal bone, right foot, subsequent encounter for fracture with routine healing: Secondary | ICD-10-CM | POA: Diagnosis not present

## 2022-04-16 ENCOUNTER — Encounter: Payer: Self-pay | Attending: Internal Medicine | Admitting: Registered"

## 2022-04-16 ENCOUNTER — Encounter: Payer: Self-pay | Admitting: Registered"

## 2022-04-16 DIAGNOSIS — Z713 Dietary counseling and surveillance: Secondary | ICD-10-CM | POA: Insufficient documentation

## 2022-04-16 DIAGNOSIS — R635 Abnormal weight gain: Secondary | ICD-10-CM | POA: Insufficient documentation

## 2022-04-16 NOTE — Patient Instructions (Addendum)
Instructions/Goals:  Goal #1: Have 3 balanced meals per day/eat every 3-5 hours while awake Prep lunches ahead of time: portion food groups in microwavable dishes to reduce barriers to having a balanced lunch each day.  Include a balance of half plate non-starchy vegetables, 1/4 protein, 1/4 starch  Assess hunger at meal times: if having cravings and/or finding yourself eating too fast to think out your choices and how your body feels, add a balanced snack such as fruit OR vegetable with hummus OR fat free Greek yogurt OR whole grain crackers and low fat cheese stick Breakfast: include protein serving + 2 carbohydrates (See handout)  Water Goal: 64-80 oz daily   High Fiber Foods:  On label, look for at least 3-4 g for all grains and 4g or more for cereals Include whole grain pastas, lentil pastas, quinoa, oatmeal, whole grain breads as grain options most of the time (over 50%)  Add flax or chia seeds to items like yogurt and oatmeal. May do 1 tbsp.   Make physical activity a part of your week. Try to include at least 30-60 minutes of physical activity 5 days each week or at least 150 minutes per week. Regular physical activity promotes overall health-including helping to reduce risk for heart disease and diabetes, promoting mental health, and helping [place] sleep better.    Activity Goal: Include at least 30-60 minutes physical activity 5 days per week.

## 2022-04-16 NOTE — Progress Notes (Signed)
Medical Nutrition Therapy:  Appt start time: 2694 end time:  1450.  Assessment:  Primary concerns today: Pt referred due to abnormal wt gain.  Pt reports she lost 30 lb on Optavia for second time trying it. Reports the first time she tried it was in June-October 2019 then second time after gaining all weight back was in 2021. Reports she has been on so many diets she feels like she doesn't know how to eat anymore. Reports Optavia is expensive and not real food so wants to make changes. Breakfast is around 7-8, lunch around noon. Reports often in a rush at breakfast, usually lunch is leftovers such as chicken, fruit or may skip lunch and be very hungry in afternoon. Reports she likes to cook and eats at home most of the time and always has dinner. Reports snacks probably too much-crackers, chips, cheese, fruit. Loves chocolate.Typical beverages include mostly water, unsweetened water such as Bubbly, likes cinnamon tea without sweetener. Occasional diet soda.   Food Allergies/Intolerances: None reported.   GI Concerns: Reports has bowel movement every other day, sometimes soft sometimes not as soft. Was going more often while doing Optavia. No other concerns.   Other Signs/Symptoms: None reported.   Pertinent Lab Values: N/A  Social/Other: Lives with husband and 3 kids, 1 in college and 2 in high school.   Weight Hx: Reports weight loss and regain from previous diets.   Preferred Learning Style:  No preference indicated   Learning Readiness:  Change in progress  MEDICATIONS: See list. Reviewed. Supplement: MVI, 500 mg calcium, 2000 IU vitamin D.    DIETARY INTAKE:  Usual eating pattern includes 2-3 meals and some snacks per day.   Common foods: N/A.  Avoided foods: Reports she doesn't usually have many carbohydrates but does like them.    Typical Snacks: chips, crackers, cheese, fruit.     Typical Beverages: mostly water 60-70 oz, unsweetened water such as Bubbly, likes cinnamon tea  without sweetener. Occasional diet soda.  Location of Meals: kitchen breakfast table.   Electronics Present at [employer]: No   24-hr recall:  B (730 AM): Keto cereal with oatmilk   Snk ( AM): None reported.   L (2-3 PM): leftover pepperoni pizza x 2 slices, water Snk ( PM): Cheez Its  D (715 PM): burger without bun, ketchup, lettuce, salad with tomatoes, peaches, corn, feta and homemade honey dressing, handful of chocolate chips, sparkling water  Snk ( PM): None reported.  Beverages: water, sparkling water   Today so Far:  B ( AM): Power Crunch protein bar, cinnamon tea Snk (AM): None reported.  L (PM): None reported.   Usual physical activity: yoga x 2 times weekly, walking x 30-40 minutes x 2 times per week, gym x ~60 minutes x 3-4 times per week (elliptical and weights).  Progress Towards Goal(s):  In progress.   Nutritional Diagnosis:  NB-1.1 Food and nutrition-related knowledge deficit As related to no prior nutrition education by a dietitian.  As evidenced by pt referred for nutrition counseling.    Intervention:  Nutrition counseling provided. Dietitian provided education regarding balanced nutrition recommendations, high fiber foods to prevent constipation and mindful eating. Discussed strategies to have 3 balanced meals consistently throughout the day and prevent skipping. Pt appeared agreeable to information/goals discussed.   Instructions/Goals:  Goal #1: Have 3 balanced meals per day/eat every 3-5 hours while awake Prep lunches ahead of time: portion food groups in microwavable dishes to reduce barriers to having a balanced lunch each  day.  Include a balance of half plate non-starchy vegetables, 1/4 protein, 1/4 starch  Assess hunger at meal times: if having cravings and/or finding yourself eating too fast to think out your choices and how your body feels, add a balanced snack such as fruit OR vegetable with hummus OR fat free Greek yogurt OR whole grain crackers and low  fat cheese stick Breakfast: include protein serving + 2 carbohydrates (See handout)  Water Goal: 64-80 oz daily   High Fiber Foods:  On label, look for at least 3-4 g for all grains and 4g or more for cereals Include whole grain pastas, lentil pastas, quinoa, oatmeal, whole grain breads as grain options most of the time (over 50%)  Add flax or chia seeds to items like yogurt and oatmeal. May do 1 tbsp.   Make physical activity a part of your week. Try to include at least 30-60 minutes of physical activity 5 days each week or at least 150 minutes per week. Regular physical activity promotes overall health-including helping to reduce risk for heart disease and diabetes, promoting mental health, and helping [place] sleep better.    Activity Goal: Include at least 30-60 minutes physical activity 5 days per week.   Teaching Method Utilized: Visual Auditory  Handouts given during visit include: Balanced plate and food list.  Balanced snack sheet.   Barriers to learning/adherence to lifestyle change: None reported.   Demonstrated degree of understanding via:  Teach Back   Monitoring/Evaluation:  Dietary intake, exercise, and body weight prn. Contact information provided.

## 2022-04-23 ENCOUNTER — Encounter: Payer: Self-pay | Admitting: Registered"

## 2022-05-21 DIAGNOSIS — Q992 Fragile X chromosome: Secondary | ICD-10-CM | POA: Diagnosis not present

## 2022-05-21 DIAGNOSIS — Z0000 Encounter for general adult medical examination without abnormal findings: Secondary | ICD-10-CM | POA: Diagnosis not present

## 2022-05-21 DIAGNOSIS — M941 Relapsing polychondritis: Secondary | ICD-10-CM | POA: Diagnosis not present

## 2022-05-21 DIAGNOSIS — Z13220 Encounter for screening for lipoid disorders: Secondary | ICD-10-CM | POA: Diagnosis not present

## 2022-05-23 DIAGNOSIS — M941 Relapsing polychondritis: Secondary | ICD-10-CM | POA: Diagnosis not present

## 2022-07-12 DIAGNOSIS — H905 Unspecified sensorineural hearing loss: Secondary | ICD-10-CM | POA: Diagnosis not present

## 2022-07-12 DIAGNOSIS — R5383 Other fatigue: Secondary | ICD-10-CM | POA: Diagnosis not present

## 2022-07-12 DIAGNOSIS — M941 Relapsing polychondritis: Secondary | ICD-10-CM | POA: Diagnosis not present

## 2022-07-12 DIAGNOSIS — D8989 Other specified disorders involving the immune mechanism, not elsewhere classified: Secondary | ICD-10-CM | POA: Diagnosis not present

## 2022-07-25 DIAGNOSIS — H905 Unspecified sensorineural hearing loss: Secondary | ICD-10-CM | POA: Diagnosis not present

## 2022-07-25 DIAGNOSIS — M941 Relapsing polychondritis: Secondary | ICD-10-CM | POA: Diagnosis not present

## 2022-08-22 DIAGNOSIS — Z03818 Encounter for observation for suspected exposure to other biological agents ruled out: Secondary | ICD-10-CM | POA: Diagnosis not present

## 2022-08-22 DIAGNOSIS — J4521 Mild intermittent asthma with (acute) exacerbation: Secondary | ICD-10-CM | POA: Diagnosis not present

## 2022-11-15 DIAGNOSIS — H1045 Other chronic allergic conjunctivitis: Secondary | ICD-10-CM | POA: Diagnosis not present

## 2022-11-15 DIAGNOSIS — H40013 Open angle with borderline findings, low risk, bilateral: Secondary | ICD-10-CM | POA: Diagnosis not present

## 2022-11-15 DIAGNOSIS — H16223 Keratoconjunctivitis sicca, not specified as Sjogren's, bilateral: Secondary | ICD-10-CM | POA: Diagnosis not present

## 2023-02-06 ENCOUNTER — Encounter: Payer: Self-pay | Admitting: Gastroenterology

## 2023-02-06 DIAGNOSIS — Z124 Encounter for screening for malignant neoplasm of cervix: Secondary | ICD-10-CM | POA: Diagnosis not present

## 2023-02-06 DIAGNOSIS — Z6828 Body mass index (BMI) 28.0-28.9, adult: Secondary | ICD-10-CM | POA: Diagnosis not present

## 2023-02-06 DIAGNOSIS — Z01419 Encounter for gynecological examination (general) (routine) without abnormal findings: Secondary | ICD-10-CM | POA: Diagnosis not present

## 2023-02-06 DIAGNOSIS — Z1231 Encounter for screening mammogram for malignant neoplasm of breast: Secondary | ICD-10-CM | POA: Diagnosis not present

## 2023-02-19 DIAGNOSIS — R87612 Low grade squamous intraepithelial lesion on cytologic smear of cervix (LGSIL): Secondary | ICD-10-CM | POA: Insufficient documentation

## 2023-02-19 DIAGNOSIS — J45909 Unspecified asthma, uncomplicated: Secondary | ICD-10-CM | POA: Insufficient documentation

## 2023-02-27 ENCOUNTER — Ambulatory Visit (AMBULATORY_SURGERY_CENTER): Payer: Self-pay | Admitting: *Deleted

## 2023-02-27 ENCOUNTER — Encounter: Payer: Self-pay | Admitting: Gastroenterology

## 2023-02-27 VITALS — Ht 62.0 in | Wt 155.0 lb

## 2023-02-27 DIAGNOSIS — Z800 Family history of malignant neoplasm of digestive organs: Secondary | ICD-10-CM

## 2023-02-27 MED ORDER — NA SULFATE-K SULFATE-MG SULF 17.5-3.13-1.6 GM/177ML PO SOLN: 1.0000 | Freq: Once | ORAL | 0 refills | Status: AC

## 2023-02-27 NOTE — Progress Notes (Signed)
Pt's name and DOB verified at the beginning of the pre-visit.  Pt denies any difficulty with ambulating,sitting, laying down or rolling side to side Gave both [facility] main # and MD on call # prior to instructions.  No egg or soy allergy known to patient  No issues known to pt with past sedation with any surgeries or procedures Pt denies having issues being intubated Pt has no issues moving head neck or swallowing No FH of Malignant Hyperthermia Pt is not on diet pills Pt is not on home 02  Pt is not on blood thinners  Pt denies issues with constipation  Pt is not on dialysis Pt denise any abnormal heart rhythms  Pt denies any upcoming cardiac testing Pt encouraged to use to use Singlecare or Goodrx to reduce cost  Patient's chart reviewed by Cathlyn Parsons CNRA prior to pre-visit and patient appropriate for the [facility].  Pre-visit completed and red dot placed by patient's name on their procedure day (on provider's schedule).  . Visit by phone Pt states weight is 155 lb Instructed pt why it is important to and  to call if they have any changes in health or new medications. Directed them to the # given and on instructions.   Pt states they will.  Instructions reviewed with pt and pt states understanding. Instructed to review again prior to procedure. Pt states they will.  Instructions sent by mail with coupon and by my chart

## 2023-03-20 ENCOUNTER — Encounter: Payer: Self-pay | Admitting: Gastroenterology

## 2023-03-20 ENCOUNTER — Ambulatory Visit: Payer: BC Managed Care – PPO | Admitting: Gastroenterology

## 2023-03-20 VITALS — BP 93/57 | HR 98 | Temp 98.0°F | Resp 10 | Ht 62.0 in | Wt 155.0 lb

## 2023-03-20 DIAGNOSIS — D125 Benign neoplasm of sigmoid colon: Secondary | ICD-10-CM | POA: Diagnosis not present

## 2023-03-20 DIAGNOSIS — Z1211 Encounter for screening for malignant neoplasm of colon: Secondary | ICD-10-CM | POA: Diagnosis not present

## 2023-03-20 DIAGNOSIS — D124 Benign neoplasm of descending colon: Secondary | ICD-10-CM | POA: Diagnosis not present

## 2023-03-20 DIAGNOSIS — Z800 Family history of malignant neoplasm of digestive organs: Secondary | ICD-10-CM

## 2023-03-20 DIAGNOSIS — D120 Benign neoplasm of cecum: Secondary | ICD-10-CM

## 2023-03-20 MED ORDER — SODIUM CHLORIDE 0.9 % IV SOLN
500.0000 mL | INTRAVENOUS | Status: DC
Start: 2023-03-20 — End: 2023-03-20

## 2023-03-20 NOTE — Op Note (Signed)
[facility] Patient Name: Karen [name] Procedure Date: 03/20/2023 1:17 PM MRN: [id number] Endoscopist: Viviann Spare P. Adela Lank , MD, [id number] Age: 52 Referring MD:  Date of Birth: Apr 24, 1971 Gender: Female Account #: [id number] Procedure:                Colonoscopy Indications:              Screening in patient at increased risk: Family                            history of 1st-degree relative with colorectal                            cancer (sister dx age 34s) Medicines:                Monitored Anesthesia Care Procedure:                Pre-Anesthesia Assessment:                           - Prior to the procedure, a History and Physical                            was performed, and patient medications and                            allergies were reviewed. The patient's tolerance of                            previous anesthesia was also reviewed. The risks                            and benefits of the procedure and the sedation                            options and risks were discussed with the patient.                            All questions were answered, and informed consent                            was obtained. Prior Anticoagulants: The patient has                            taken no anticoagulant or antiplatelet agents. ASA                            Grade Assessment: II - A patient with mild systemic                            disease. After reviewing the risks and benefits,                            the patient was deemed in satisfactory condition to  undergo the procedure.                           After obtaining informed consent, the colonoscope                            was passed under direct vision. Throughout the                            procedure, the patient's blood pressure, pulse, and                            oxygen saturations were monitored continuously. The                            Olympus PCF-H190DL ([id number])  Colonoscope was                            introduced through the anus and advanced to the the                            cecum, identified by appendiceal orifice and                            ileocecal valve. The colonoscopy was performed                            without difficulty. The patient tolerated the                            procedure well. The quality of the bowel                            preparation was good. The ileocecal valve,                            appendiceal orifice, and rectum were photographed. Scope In: 1:22:38 PM Scope Out: 1:41:42 PM Scope Withdrawal Time: 0 hours 12 minutes 3 seconds  Total Procedure Duration: 0 hours 19 minutes 4 seconds  Findings:                 The perianal and digital rectal examinations were                            normal.                           A 3 mm polyp was found in the cecum. The polyp was                            flat. The polyp was removed with a cold snare.                            Resection and retrieval were complete.  A 3 mm polyp was found in the ascending colon. The                            polyp was sessile. The polyp was removed with a                            cold snare. Resection and retrieval were complete.                           Two sessile polyps were found in the sigmoid colon.                            The polyps were 3 to 4 mm in size. These polyps                            were removed with a cold snare. Resection and                            retrieval were complete.                           A few small-mouthed diverticula were found in the                            sigmoid colon.                           The colon was tortuous.                           The exam was otherwise without abnormality. Complications:            No immediate complications. Estimated blood loss:                            Minimal. Estimated Blood Loss:     Estimated blood loss was  minimal. Impression:               - One 3 mm polyp in the cecum, removed with a cold                            snare. Resected and retrieved.                           - One 3 mm polyp in the ascending colon, removed                            with a cold snare. Resected and retrieved.                           - Two 3 to 4 mm polyps in the sigmoid colon,                            removed with a cold snare.  Resected and retrieved.                           - Diverticulosis in the sigmoid colon.                           - Tortuous colon.                           - The examination was otherwise normal.                           - The GI Genius (intelligent endoscopy module),                            computer-aided polyp detection system powered by AI                            was utilized to detect colorectal polyps through                            enhanced visualization during colonoscopy. Recommendation:           - Patient has a contact number available for                            emergencies. The signs and symptoms of potential                            delayed complications were discussed with the                            patient. Return to normal activities tomorrow.                            Written discharge instructions were provided to the                            patient.                           - Resume previous diet.                           - Continue present medications.                           - Await pathology results. Viviann Spare P. Sneijder Bernards, MD 03/20/2023 1:48:28 PM This report has been signed electronically.

## 2023-03-20 NOTE — Progress Notes (Signed)
Report to PACU, RN, vss, BBS= Clear.  

## 2023-03-20 NOTE — Patient Instructions (Addendum)
Resume previous diet. Continue present medications. Await pathology results.   YOU HAD AN ENDOSCOPIC PROCEDURE TODAY AT THE [facility]:   Refer to the procedure report that was given to you for any specific questions about what was found during the examination.  If the procedure report does not answer your questions, please call your gastroenterologist to clarify.  If you requested that your care partner not be given the details of your procedure findings, then the procedure report has been included in a sealed envelope for you to review at your convenience later.  YOU SHOULD EXPECT: Some feelings of bloating in the abdomen. Passage of more gas than usual.  Walking can help get rid of the air that was put into your GI tract during the procedure and reduce the bloating. If you had a lower endoscopy (such as a colonoscopy or flexible sigmoidoscopy) you may notice spotting of blood in your stool or on the toilet paper. If you underwent a bowel prep for your procedure, you may not have a normal bowel movement for a few days.  Please Note:  You might notice some irritation and congestion in your nose or some drainage.  This is from the oxygen used during your procedure.  There is no need for concern and it should clear up in a day or so.  SYMPTOMS TO REPORT IMMEDIATELY:  Following lower endoscopy (colonoscopy or flexible sigmoidoscopy):  Excessive amounts of blood in the stool  Significant tenderness or worsening of abdominal pains  Swelling of the abdomen that is new, acute  Fever of 100F or higher   For urgent or emergent issues, a gastroenterologist can be reached at any hour by calling [phone]. Do not use MyChart messaging for urgent concerns.    DIET:  We do recommend a small meal at first, but then you may proceed to your regular diet.  Drink plenty of fluids but you should avoid alcoholic beverages for 24 hours.  ACTIVITY:  You should plan to take it easy for the  rest of today and you should NOT DRIVE or use heavy machinery until tomorrow (because of the sedation medicines used during the test).    FOLLOW UP: Our staff will call the number listed on your records the next business day following your procedure.  We will call around 7:15- 8:00 am to check on you and address any questions or concerns that you may have regarding the information given to you following your procedure. If we do not reach you, we will leave a message.     If any biopsies were taken you will be contacted by phone or by letter within the next 1-3 weeks.  Please call us at [phone] if you have not heard about the biopsies in 3 weeks.    SIGNATURES/CONFIDENTIALITY: You and/or your care partner have signed paperwork which will be entered into your electronic medical record.  These signatures attest to the fact that that the information above on your After Visit Summary has been reviewed and is understood.  Full responsibility of the confidentiality of this discharge information lies with you and/or your care-partner. 

## 2023-03-20 NOTE — Progress Notes (Signed)
Patient reports no health or medications changes since pre visit.

## 2023-03-20 NOTE — Progress Notes (Signed)
[facility] History and Physical   Primary Care Physician:  Marden Noble, MD (Inactive)   Reason for Procedure:   Family history of colon cancer  Plan:    colonoscopy     HPI: DAJON ROWE is a 52 y.o. female  here for colonoscopy screening  - last exam about 5 years ago. Sister had CRC age 68s. No adenomas or precancerous polyps on the last exam. Patient denies any bowel symptoms at this time.  Otherwise feels well without any cardiopulmonary symptoms.   I have discussed risks / benefits of anesthesia and endoscopic procedure with Burke Keels and they wish to proceed with the exams as outlined today.    Past Medical History:  Diagnosis Date   Anxiety    Asthma     Past Surgical History:  Procedure Laterality Date   APPENDECTOMY     DIAGNOSTIC LAPAROSCOPY     DILITATION & CURRETTAGE/HYSTROSCOPY WITH HYDROTHERMAL ABLATION  12/05/2014   Procedure: DILATATION & CURETTAGE/HYSTEROSCOPY WITH HYDROTHERMAL ABLATION;  Surgeon: Jeani Hawking, MD;  Location: [facility];  Service: Gynecology;;   HYSTEROSCOPY WITH D & C  12/05/2014   Procedure: DILATATION AND CURETTAGE Melton Krebs;  Surgeon: Jeani Hawking, MD;  Location: [facility];  Service: Gynecology;;    Prior to Admission medications   Medication Sig Start Date End Date Taking? Authorizing Provider  cetirizine (ZYRTEC) 10 MG tablet Take 10 mg by mouth daily.   Yes [provider]  fexofenadine (ALLEGRA ODT) 30 MG disintegrating tablet Take 30 mg by mouth daily.   Yes [provider]  VITAMIN D PO Take by mouth.   Yes [provider]  buPROPion (WELLBUTRIN XL) 300 MG 24 hr tablet Take 150 mg by mouth daily. Patient not taking: Reported on 03/20/2023    [provider]  CALCIUM PO Take by mouth.    [provider]  diclofenac (VOLTAREN) 75 MG EC tablet Take 1 tablet (75 mg total) by mouth 2 (two) times daily. Patient not taking: Reported on 02/27/2023 01/22/18   Lenn Sink, DPM  ESTRACE VAGINAL 0.1 MG/GM vaginal cream APPLY 1/2 APPLICATORFUL PER VAGINA 3 TIMES PER WEEK AS NEEDED. 01/18/17   [provider]  fluticasone (FLONASE) 50 MCG/ACT nasal spray Place into both nostrils daily. Patient not taking: Reported on 02/27/2023    [provider]  ibuprofen (ADVIL,MOTRIN) 200 MG tablet Take 800 mg by mouth every 6 (six) hours as needed for mild pain. Patient not taking: Reported on 02/27/2023    [provider]  Multiple Vitamins-Minerals (MULTIVITAMIN ADULTS 50+ PO) Take by mouth.    [provider]  Omega-3 Fatty Acids (FISH OIL PO) Take by mouth. Patient not taking: Reported on 02/27/2023    [provider]  PROGESTERONE MICRONIZED PO Take by mouth.    [provider]  VALACYCLOVIR HCL PO Take by mouth.    [provider]    Current Outpatient Medications  Medication Sig Dispense Refill   cetirizine (ZYRTEC) 10 MG tablet Take 10 mg by mouth daily.     fexofenadine (ALLEGRA ODT) 30 MG disintegrating tablet Take 30 mg by mouth daily.     VITAMIN D PO Take by mouth.     buPROPion (WELLBUTRIN XL) 300 MG 24 hr tablet Take 150 mg by mouth daily. (Patient not taking: Reported on 03/20/2023)     CALCIUM PO Take by mouth.     diclofenac (VOLTAREN) 75 MG EC tablet Take 1 tablet (75 mg  total) by mouth 2 (two) times daily. (Patient not taking: Reported on 02/27/2023) 50 tablet 2   ESTRACE VAGINAL 0.1 MG/GM vaginal cream APPLY 1/2 APPLICATORFUL PER VAGINA 3 TIMES PER WEEK AS NEEDED.  3   fluticasone (FLONASE) 50 MCG/ACT nasal spray Place into both nostrils daily. (Patient not taking: Reported on 02/27/2023)     ibuprofen (ADVIL,MOTRIN) 200 MG tablet Take 800 mg by mouth every 6 (six) hours as needed for mild pain. (Patient not taking: Reported on 02/27/2023)     Multiple Vitamins-Minerals (MULTIVITAMIN ADULTS 50+ PO) Take by mouth.     Omega-3 Fatty Acids (FISH OIL PO) Take by mouth. (Patient not taking: Reported on  02/27/2023)     PROGESTERONE MICRONIZED PO Take by mouth.     VALACYCLOVIR HCL PO Take by mouth.     Current Facility-Administered Medications  Medication Dose Route Frequency Provider Last Rate Last Admin   0.9 %  sodium chloride infusion  500 mL Intravenous Once Rachael Fee, MD       0.9 %  sodium chloride infusion  500 mL Intravenous Continuous Karsten Vaughn, Willaim Rayas, MD       methylPREDNISolone acetate (DEPO-MEDROL) injection 40 mg  40 mg Intra-articular Once Myra Rude, MD        Allergies as of 03/20/2023 - Review Complete 03/20/2023  Allergen Reaction Noted   Augmentin [amoxicillin-pot clavulanate] Other (See Comments) 11/24/2014   Clindamycin/lincomycin Other (See Comments) 11/24/2014    Family History  Problem Relation Age of Onset   Hypertension Mother    Asthma Mother    Hypertension Father    Cancer Sister    Colon cancer Sister 22   Cancer Maternal Aunt    Diabetes Maternal Aunt    Breast cancer Maternal Aunt    Pancreatic cancer Maternal Aunt    Cancer Maternal Uncle    Cancer Maternal Grandmother    Asthma Maternal Grandmother    Liver cancer Maternal Grandmother    Cancer Paternal Grandmother    Diabetes Paternal Grandmother    Heart disease Paternal Grandmother    Cancer Paternal Grandfather    Diabetes Paternal Grandfather    Heart disease Paternal Grandfather     Social History   Socioeconomic History   Marital status: Married    Spouse name: Reuel Boom   Number of children: 3   Years of education: Not on file   Highest education level: Not on file  Occupational History   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  Tobacco Use   Smoking status: Never   Smokeless tobacco: Never  Vaping Use   Vaping Use: Never used  Substance and Sexual Activity   Alcohol use: No   Drug use: No   Sexual activity: Never  Other Topics Concern   Not on file  Social History Narrative   Not on file   Social Determinants of Health   Financial Resource  Strain: Not on file  Food Insecurity: No Food Insecurity (04/16/2022)   Hunger Vital Sign    Worried About Running Out of Food in the Last Year: Never true    Ran Out of Food in the Last Year: Never true  Transportation Needs: Not on file  Physical Activity: Not on file  Stress: Not on file  Social Connections: Not on file  Intimate Partner Violence: Not on file    Review of Systems: All other review of systems negative except as mentioned in the HPI.  Physical Exam: Vital signs BP (!) 114/59  Pulse 62   Temp 98 F (36.7 C)   Ht 5\' 2"  (1.575 m)   Wt 155 lb (70.3 kg)   SpO2 98%   BMI 28.35 kg/m   General:   Alert,  Well-developed, pleasant and cooperative in NAD Lungs:  Clear throughout to auscultation.   Heart:  Regular rate and rhythm Abdomen:  Soft, nontender and nondistended.   Neuro/Psych:  Alert and cooperative. Normal mood and affect. A and O x 3  Harlin Rain, MD [facility]

## 2023-03-21 ENCOUNTER — Telehealth: Payer: Self-pay

## 2023-03-21 NOTE — Telephone Encounter (Signed)
  Follow up Call-     03/20/2023    1:01 PM  Call back number  Post procedure Call Back phone  # [phone]  Permission to leave phone message Yes     Patient questions:  Do you have a fever, pain , or abdominal swelling? No. Pain Score  0 *  Have you tolerated food without any problems? Yes.    Have you been able to return to your normal activities? Yes.    Do you have any questions about your discharge instructions: Diet   No. Medications  No. Follow up visit  No.  Do you have questions or concerns about your Care? No.  Actions: * If pain score is 4 or above: No action needed, pain <4.

## 2023-03-26 ENCOUNTER — Encounter: Payer: Self-pay | Admitting: Gastroenterology

## 2023-04-09 DIAGNOSIS — Z6829 Body mass index (BMI) 29.0-29.9, adult: Secondary | ICD-10-CM | POA: Diagnosis not present

## 2023-04-09 DIAGNOSIS — Z713 Dietary counseling and surveillance: Secondary | ICD-10-CM | POA: Diagnosis not present

## 2023-07-03 DIAGNOSIS — Z713 Dietary counseling and surveillance: Secondary | ICD-10-CM | POA: Diagnosis not present

## 2023-07-03 DIAGNOSIS — Z6827 Body mass index (BMI) 27.0-27.9, adult: Secondary | ICD-10-CM | POA: Diagnosis not present

## 2023-07-10 DIAGNOSIS — J45909 Unspecified asthma, uncomplicated: Secondary | ICD-10-CM | POA: Diagnosis not present

## 2023-07-10 DIAGNOSIS — J208 Acute bronchitis due to other specified organisms: Secondary | ICD-10-CM | POA: Diagnosis not present

## 2023-07-10 DIAGNOSIS — E7800 Pure hypercholesterolemia, unspecified: Secondary | ICD-10-CM | POA: Diagnosis not present

## 2023-07-10 DIAGNOSIS — B9689 Other specified bacterial agents as the cause of diseases classified elsewhere: Secondary | ICD-10-CM | POA: Diagnosis not present

## 2023-07-10 DIAGNOSIS — Z0000 Encounter for general adult medical examination without abnormal findings: Secondary | ICD-10-CM | POA: Diagnosis not present

## 2023-09-12 DIAGNOSIS — C44319 Basal cell carcinoma of skin of other parts of face: Secondary | ICD-10-CM | POA: Diagnosis not present

## 2023-09-16 DIAGNOSIS — H40013 Open angle with borderline findings, low risk, bilateral: Secondary | ICD-10-CM | POA: Diagnosis not present

## 2023-09-16 DIAGNOSIS — H0288A Meibomian gland dysfunction right eye, upper and lower eyelids: Secondary | ICD-10-CM | POA: Diagnosis not present

## 2023-09-16 DIAGNOSIS — H52223 Regular astigmatism, bilateral: Secondary | ICD-10-CM | POA: Diagnosis not present

## 2023-09-16 DIAGNOSIS — H0288B Meibomian gland dysfunction left eye, upper and lower eyelids: Secondary | ICD-10-CM | POA: Diagnosis not present

## 2023-09-16 DIAGNOSIS — H524 Presbyopia: Secondary | ICD-10-CM | POA: Diagnosis not present

## 2023-09-16 DIAGNOSIS — H16223 Keratoconjunctivitis sicca, not specified as Sjogren's, bilateral: Secondary | ICD-10-CM | POA: Diagnosis not present

## 2023-09-16 DIAGNOSIS — H5203 Hypermetropia, bilateral: Secondary | ICD-10-CM | POA: Diagnosis not present

## 2023-10-16 DIAGNOSIS — Z713 Dietary counseling and surveillance: Secondary | ICD-10-CM | POA: Diagnosis not present

## 2023-10-16 DIAGNOSIS — Z6825 Body mass index (BMI) 25.0-25.9, adult: Secondary | ICD-10-CM | POA: Diagnosis not present

## 2024-01-08 DIAGNOSIS — H905 Unspecified sensorineural hearing loss: Secondary | ICD-10-CM | POA: Diagnosis not present

## 2024-01-08 DIAGNOSIS — M941 Relapsing polychondritis: Secondary | ICD-10-CM | POA: Diagnosis not present

## 2024-01-15 DIAGNOSIS — Z6825 Body mass index (BMI) 25.0-25.9, adult: Secondary | ICD-10-CM | POA: Diagnosis not present

## 2024-01-15 DIAGNOSIS — Z713 Dietary counseling and surveillance: Secondary | ICD-10-CM | POA: Diagnosis not present

## 2024-01-26 DIAGNOSIS — Z130 Encounter for screening for diseases of the blood and blood-forming organs and certain disorders involving the immune mechanism: Secondary | ICD-10-CM | POA: Diagnosis not present

## 2024-02-12 DIAGNOSIS — Z6825 Body mass index (BMI) 25.0-25.9, adult: Secondary | ICD-10-CM | POA: Diagnosis not present

## 2024-02-12 DIAGNOSIS — Z1231 Encounter for screening mammogram for malignant neoplasm of breast: Secondary | ICD-10-CM | POA: Diagnosis not present

## 2024-02-12 DIAGNOSIS — Z124 Encounter for screening for malignant neoplasm of cervix: Secondary | ICD-10-CM | POA: Diagnosis not present

## 2024-02-12 DIAGNOSIS — Z01419 Encounter for gynecological examination (general) (routine) without abnormal findings: Secondary | ICD-10-CM | POA: Diagnosis not present

## 2024-03-10 DIAGNOSIS — M25531 Pain in right wrist: Secondary | ICD-10-CM | POA: Diagnosis not present

## 2024-03-25 DIAGNOSIS — D3613 Benign neoplasm of peripheral nerves and autonomic nervous system of lower limb, including hip: Secondary | ICD-10-CM | POA: Diagnosis not present

## 2024-04-12 DIAGNOSIS — M25531 Pain in right wrist: Secondary | ICD-10-CM | POA: Diagnosis not present

## 2024-05-17 DIAGNOSIS — N760 Acute vaginitis: Secondary | ICD-10-CM | POA: Diagnosis not present

## 2024-05-17 DIAGNOSIS — N952 Postmenopausal atrophic vaginitis: Secondary | ICD-10-CM | POA: Diagnosis not present

## 2024-05-28 DIAGNOSIS — Z13820 Encounter for screening for osteoporosis: Secondary | ICD-10-CM | POA: Diagnosis not present

## 2024-06-03 DIAGNOSIS — Z6825 Body mass index (BMI) 25.0-25.9, adult: Secondary | ICD-10-CM | POA: Diagnosis not present

## 2024-06-03 DIAGNOSIS — Z713 Dietary counseling and surveillance: Secondary | ICD-10-CM | POA: Diagnosis not present

## 2024-07-16 DIAGNOSIS — Z1231 Encounter for screening mammogram for malignant neoplasm of breast: Secondary | ICD-10-CM | POA: Diagnosis not present

## 2024-07-16 DIAGNOSIS — Z800 Family history of malignant neoplasm of digestive organs: Secondary | ICD-10-CM | POA: Diagnosis not present

## 2024-07-16 DIAGNOSIS — Z0000 Encounter for general adult medical examination without abnormal findings: Secondary | ICD-10-CM | POA: Diagnosis not present

## 2024-07-16 DIAGNOSIS — M941 Relapsing polychondritis: Secondary | ICD-10-CM | POA: Diagnosis not present

## 2024-07-16 DIAGNOSIS — Z23 Encounter for immunization: Secondary | ICD-10-CM | POA: Diagnosis not present

## 2024-07-22 DIAGNOSIS — Z713 Dietary counseling and surveillance: Secondary | ICD-10-CM | POA: Diagnosis not present

## 2024-07-22 DIAGNOSIS — Z6824 Body mass index (BMI) 24.0-24.9, adult: Secondary | ICD-10-CM | POA: Diagnosis not present
